# Patient Record
Sex: Female | Born: 1965 | Race: Black or African American | Hispanic: No | Marital: Single | Smoking: Never smoker
Health system: Southern US, Community
[De-identification: ages and names within clinical notes are randomized; demographics above are authoritative.]

## PROBLEM LIST (undated history)

## (undated) DIAGNOSIS — Z780 Asymptomatic menopausal state: Secondary | ICD-10-CM

## (undated) DIAGNOSIS — A6000 Herpesviral infection of urogenital system, unspecified: Secondary | ICD-10-CM

## (undated) DIAGNOSIS — E7800 Pure hypercholesterolemia, unspecified: Secondary | ICD-10-CM

## (undated) DIAGNOSIS — IMO0002 Reserved for concepts with insufficient information to code with codable children: Secondary | ICD-10-CM

## (undated) DIAGNOSIS — M5126 Other intervertebral disc displacement, lumbar region: Secondary | ICD-10-CM

## (undated) DIAGNOSIS — D509 Iron deficiency anemia, unspecified: Secondary | ICD-10-CM

## (undated) DIAGNOSIS — B977 Papillomavirus as the cause of diseases classified elsewhere: Secondary | ICD-10-CM

## (undated) DIAGNOSIS — J449 Chronic obstructive pulmonary disease, unspecified: Secondary | ICD-10-CM

## (undated) DIAGNOSIS — R87619 Unspecified abnormal cytological findings in specimens from cervix uteri: Secondary | ICD-10-CM

## (undated) DIAGNOSIS — R079 Chest pain, unspecified: Secondary | ICD-10-CM

## (undated) DIAGNOSIS — E559 Vitamin D deficiency, unspecified: Secondary | ICD-10-CM

## (undated) DIAGNOSIS — K219 Gastro-esophageal reflux disease without esophagitis: Secondary | ICD-10-CM

## (undated) DIAGNOSIS — M51369 Other intervertebral disc degeneration, lumbar region without mention of lumbar back pain or lower extremity pain: Secondary | ICD-10-CM

## (undated) DIAGNOSIS — D219 Benign neoplasm of connective and other soft tissue, unspecified: Secondary | ICD-10-CM

## (undated) DIAGNOSIS — J302 Other seasonal allergic rhinitis: Secondary | ICD-10-CM

## (undated) DIAGNOSIS — I10 Essential (primary) hypertension: Secondary | ICD-10-CM

## (undated) DIAGNOSIS — M549 Dorsalgia, unspecified: Secondary | ICD-10-CM

## (undated) HISTORY — DX: Asymptomatic menopausal state: Z78.0

## (undated) HISTORY — DX: Other intervertebral disc degeneration, lumbar region without mention of lumbar back pain or lower extremity pain: M51.369

## (undated) HISTORY — DX: Chest pain, unspecified: R07.9

## (undated) HISTORY — DX: Essential (primary) hypertension: I10

## (undated) HISTORY — PX: WISDOM TOOTH EXTRACTION: SHX21

## (undated) HISTORY — DX: Pure hypercholesterolemia, unspecified: E78.00

## (undated) HISTORY — DX: Gastro-esophageal reflux disease without esophagitis: K21.9

## (undated) HISTORY — DX: Dorsalgia, unspecified: M54.9

## (undated) HISTORY — DX: Vitamin D deficiency, unspecified: E55.9

## (undated) HISTORY — DX: Unspecified abnormal cytological findings in specimens from cervix uteri: R87.619

## (undated) HISTORY — DX: Papillomavirus as the cause of diseases classified elsewhere: B97.7

## (undated) HISTORY — DX: Herpesviral infection of urogenital system, unspecified: A60.00

## (undated) HISTORY — DX: Benign neoplasm of connective and other soft tissue, unspecified: D21.9

## (undated) HISTORY — DX: Reserved for concepts with insufficient information to code with codable children: IMO0002

## (undated) HISTORY — DX: Other intervertebral disc displacement, lumbar region: M51.26

## (undated) HISTORY — DX: Iron deficiency anemia, unspecified: D50.9

## (undated) HISTORY — DX: Other seasonal allergic rhinitis: J30.2

---

## 1998-02-27 ENCOUNTER — Ambulatory Visit (HOSPITAL_COMMUNITY): Admission: RE | Admit: 1998-02-27 | Discharge: 1998-02-27 | Payer: Self-pay | Admitting: *Deleted

## 1998-05-03 ENCOUNTER — Other Ambulatory Visit: Admission: RE | Admit: 1998-05-03 | Discharge: 1998-05-03 | Payer: Self-pay | Admitting: Obstetrics & Gynecology

## 1998-10-27 ENCOUNTER — Ambulatory Visit (HOSPITAL_COMMUNITY): Admission: RE | Admit: 1998-10-27 | Discharge: 1998-10-27 | Payer: Self-pay | Admitting: *Deleted

## 1999-06-12 ENCOUNTER — Ambulatory Visit (HOSPITAL_COMMUNITY): Admission: RE | Admit: 1999-06-12 | Discharge: 1999-06-12 | Payer: Self-pay | Admitting: Cardiology

## 1999-10-11 ENCOUNTER — Other Ambulatory Visit: Admission: RE | Admit: 1999-10-11 | Discharge: 1999-10-11 | Payer: Self-pay | Admitting: Obstetrics & Gynecology

## 2000-10-25 ENCOUNTER — Emergency Department (HOSPITAL_COMMUNITY): Admission: EM | Admit: 2000-10-25 | Discharge: 2000-10-25 | Payer: Self-pay | Admitting: Emergency Medicine

## 2001-10-02 ENCOUNTER — Other Ambulatory Visit: Admission: RE | Admit: 2001-10-02 | Discharge: 2001-10-02 | Payer: Self-pay | Admitting: Obstetrics and Gynecology

## 2001-10-06 ENCOUNTER — Encounter: Payer: Self-pay | Admitting: Obstetrics and Gynecology

## 2001-10-06 ENCOUNTER — Encounter: Admission: RE | Admit: 2001-10-06 | Discharge: 2001-10-06 | Payer: Self-pay | Admitting: Obstetrics and Gynecology

## 2002-08-20 ENCOUNTER — Ambulatory Visit (HOSPITAL_BASED_OUTPATIENT_CLINIC_OR_DEPARTMENT_OTHER): Admission: RE | Admit: 2002-08-20 | Discharge: 2002-08-20 | Payer: Self-pay | Admitting: Urology

## 2002-10-19 ENCOUNTER — Encounter (HOSPITAL_BASED_OUTPATIENT_CLINIC_OR_DEPARTMENT_OTHER): Payer: Self-pay | Admitting: General Surgery

## 2002-10-19 ENCOUNTER — Encounter: Admission: RE | Admit: 2002-10-19 | Discharge: 2002-10-19 | Payer: Self-pay | Admitting: General Surgery

## 2003-10-12 ENCOUNTER — Encounter: Admission: RE | Admit: 2003-10-12 | Discharge: 2003-10-12 | Payer: Self-pay | Admitting: Obstetrics and Gynecology

## 2003-10-12 ENCOUNTER — Encounter: Payer: Self-pay | Admitting: Obstetrics and Gynecology

## 2003-11-01 ENCOUNTER — Other Ambulatory Visit: Admission: RE | Admit: 2003-11-01 | Discharge: 2003-11-01 | Payer: Self-pay | Admitting: Obstetrics and Gynecology

## 2004-01-10 ENCOUNTER — Encounter: Admission: RE | Admit: 2004-01-10 | Discharge: 2004-01-10 | Payer: Self-pay | Admitting: Obstetrics and Gynecology

## 2005-01-24 ENCOUNTER — Other Ambulatory Visit: Admission: RE | Admit: 2005-01-24 | Discharge: 2005-01-24 | Payer: Self-pay | Admitting: Obstetrics and Gynecology

## 2005-04-30 ENCOUNTER — Encounter: Admission: RE | Admit: 2005-04-30 | Discharge: 2005-04-30 | Payer: Self-pay | Admitting: Obstetrics and Gynecology

## 2006-06-02 ENCOUNTER — Encounter: Admission: RE | Admit: 2006-06-02 | Discharge: 2006-06-02 | Payer: Self-pay | Admitting: Obstetrics and Gynecology

## 2006-06-25 ENCOUNTER — Other Ambulatory Visit: Admission: RE | Admit: 2006-06-25 | Discharge: 2006-06-25 | Payer: Self-pay | Admitting: Obstetrics and Gynecology

## 2007-03-27 ENCOUNTER — Emergency Department (HOSPITAL_COMMUNITY): Admission: EM | Admit: 2007-03-27 | Discharge: 2007-03-27 | Payer: Self-pay | Admitting: Emergency Medicine

## 2007-07-30 ENCOUNTER — Encounter: Admission: RE | Admit: 2007-07-30 | Discharge: 2007-07-30 | Payer: Self-pay | Admitting: Internal Medicine

## 2007-08-06 ENCOUNTER — Encounter: Admission: RE | Admit: 2007-08-06 | Discharge: 2007-08-06 | Payer: Self-pay | Admitting: Internal Medicine

## 2008-02-26 ENCOUNTER — Emergency Department (HOSPITAL_COMMUNITY): Admission: EM | Admit: 2008-02-26 | Discharge: 2008-02-26 | Payer: Self-pay | Admitting: Emergency Medicine

## 2008-08-23 ENCOUNTER — Encounter: Admission: RE | Admit: 2008-08-23 | Discharge: 2008-08-23 | Payer: Self-pay | Admitting: Internal Medicine

## 2008-11-29 ENCOUNTER — Encounter: Admission: RE | Admit: 2008-11-29 | Discharge: 2008-11-29 | Payer: Self-pay | Admitting: Internal Medicine

## 2009-08-29 ENCOUNTER — Encounter: Admission: RE | Admit: 2009-08-29 | Discharge: 2009-08-29 | Payer: Self-pay | Admitting: Obstetrics and Gynecology

## 2011-05-17 NOTE — Op Note (Signed)
   Michelle Joseph, DOWERS                        ACCOUNT NO.:  [id number]   MEDICAL RECORD NO.:  [id number]                    PATIENT TYPE:   LOCATION:                                       FACILITY:   PHYSICIAN:  Lindaann Slough, M.D.               DATE OF BIRTH:   DATE OF PROCEDURE:  08/20/2002  DATE OF DISCHARGE:                                 OPERATIVE REPORT   PREOPERATIVE DIAGNOSIS:  1. Recurrent urinary tract infection.  2. Meatal stenosis.   POSTOPERATIVE DIAGNOSES:  1. Recurrent urinary tract infection.  2. Meatal stenosis.   PROCEDURES DONE:  1. Cystoscopy.  2. Urethral dilation.   SURGEON:  Lindaann Slough, M.D.   ANESTHESIA:  General.   INDICATIONS:  The patient is a 45 year old female who has been complaining  of frequency, urgency and suprapubic discomfort.  She was treated with  Detrol LA, but her symptoms have persisted.  She is scheduled today for  cystoscopy and urethral dilation.   DESCRIPTION OF PROCEDURE:  Under general anesthesia, the patient was prepped  and draped and placed in the dorsal lithotomy position.  A #22 [place]  cystoscope was inserted in the bladder.  The bladder mucosa is normal.  There is no stone or tumor in the bladder.  The ureteral orifices are in  normal position and shape, with clear efflux.  The bladder capacity is about  900 cc.  The cystoscope was then removed.  The urethra was dilated with 98-  [place].  The patient tolerated the procedure well and left the OR in  satisfactory condition to the post-anesthesia care unit.                                                Lindaann Slough, M.D.    MN/MEDQ  D:  08/20/2002  T:  08/23/2002  Job:  [id number]

## 2012-04-13 ENCOUNTER — Telehealth: Payer: Self-pay | Admitting: Obstetrics and Gynecology

## 2012-04-13 NOTE — Telephone Encounter (Signed)
Routed to ep

## 2012-04-14 ENCOUNTER — Telehealth: Payer: Self-pay | Admitting: Obstetrics and Gynecology

## 2012-04-14 NOTE — Telephone Encounter (Signed)
PT CALLED WANTING TO KNOW IF SHE NEEDS TO CONTINUE DOING HER Q4MONTH PAPS, INFORMED PER EP, NEEDS ONE MORE PAP, WAS DUE IN MARCH, PT SCHED RPT PAP ON Monday 04/27/12 @ 2 PM WITH EP.

## 2012-04-27 ENCOUNTER — Encounter: Payer: Self-pay | Admitting: Obstetrics and Gynecology

## 2012-04-28 ENCOUNTER — Ambulatory Visit (INDEPENDENT_AMBULATORY_CARE_PROVIDER_SITE_OTHER): Payer: No Typology Code available for payment source | Admitting: Obstetrics and Gynecology

## 2012-04-28 ENCOUNTER — Encounter: Payer: Self-pay | Admitting: Obstetrics and Gynecology

## 2012-04-28 VITALS — BP 116/72 | HR 70 | Ht 69.0 in | Wt 157.0 lb

## 2012-04-28 DIAGNOSIS — D259 Leiomyoma of uterus, unspecified: Secondary | ICD-10-CM

## 2012-04-28 DIAGNOSIS — Z124 Encounter for screening for malignant neoplasm of cervix: Secondary | ICD-10-CM

## 2012-04-28 DIAGNOSIS — R82998 Other abnormal findings in urine: Secondary | ICD-10-CM

## 2012-04-28 DIAGNOSIS — R8290 Unspecified abnormal findings in urine: Secondary | ICD-10-CM

## 2012-04-28 DIAGNOSIS — Z01419 Encounter for gynecological examination (general) (routine) without abnormal findings: Secondary | ICD-10-CM

## 2012-04-28 DIAGNOSIS — M5126 Other intervertebral disc displacement, lumbar region: Secondary | ICD-10-CM

## 2012-04-28 DIAGNOSIS — N898 Other specified noninflammatory disorders of vagina: Secondary | ICD-10-CM

## 2012-04-28 DIAGNOSIS — D219 Benign neoplasm of connective and other soft tissue, unspecified: Secondary | ICD-10-CM | POA: Insufficient documentation

## 2012-04-28 DIAGNOSIS — A6000 Herpesviral infection of urogenital system, unspecified: Secondary | ICD-10-CM

## 2012-04-28 LAB — POCT WET PREP (WET MOUNT): Clue Cells Wet Prep Whiff POC: NEGATIVE

## 2012-04-28 LAB — POCT URINALYSIS DIPSTICK
Leukocytes, UA: NEGATIVE
Nitrite, UA: NEGATIVE
Urobilinogen, UA: NEGATIVE

## 2012-04-28 MED ORDER — FLUCONAZOLE 150 MG PO TABS: 150.0000 mg | ORAL_TABLET | Freq: Once | ORAL | Status: AC
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
## 2012-04-28 NOTE — Patient Instructions (Signed)
Give patient information on all types of hysterectomies.  You may research myomectomy and fibroid embolization (uterine artery embolization)

## 2012-04-28 NOTE — Progress Notes (Signed)
Subjective:    Michelle Joseph is a 46 y.o. female, G1P1001, who presents for an annual exam. She reports an itchy vaginal discharge x several weeks with varying intensity.    History   Social History  . Marital Status: Divorced    Spouse Name: N/A    Number of Children: N/A  . Years of Education: N/A   Social History Main Topics  . Smoking status: Never Smoker   . Smokeless tobacco: None  . Alcohol Use: No  . Drug Use: No  . Sexually Active: Not Currently -- Female partner(s)    Birth Control/ Protection: None   Other Topics Concern  . None   Social History Narrative  . None    Menstrual cycle:   LMP: Patient's last menstrual period was 04/12/2012.           Cycle: flow is light and with minimal cramping  The following portions of the patient's history were reviewed and updated as appropriate: allergies, current medications, past family history, past medical history, past social history, past surgical history and problem list.  Review of Systems Pertinent items are noted in HPI. Breast:Negative for breast lump,nipple discharge or nipple retraction Gastrointestinal: Negative for abdominal pain, change in bowel habits or rectal bleeding Urinary:negative   Objective:    BP 116/72  Pulse 70  Ht 5\' 9"  (1.753 m)  Wt 157 lb (71.215 kg)  BMI 23.18 kg/m2  LMP 04/12/2012    Weight:  Wt Readings from Last 1 Encounters:  04/28/12 157 lb (71.215 kg)          BMI: Body mass index is 23.18 kg/(m^2).  General Appearance: Alert, appropriate appearance for age. No acute distress HEENT: Grossly normal Neck / Thyroid: Supple, no masses, nodes or enlargement Lungs: clear to auscultation bilaterally Back: No CVA tenderness Breast Exam: No masses or nodes.No dimpling, nipple retraction or discharge. Cardiovascular: Regular rate and rhythm. S1, S2, no murmur Gastrointestinal: Soft, no organomegaly, firm mass from pelvis to level of umbilicus on left, mildly tender Pelvic Exam:  EGBUS-wnl, vagina-rugous, cervix-displaced very anterior,  uterus 16-18 weeks size, irregular and mobile Rectovaginal: normal rectal, no masses Lymphatic Exam: Non-palpable nodes in neck, clavicular, axillary, or inguinal regions Skin: no rash or abnormalities Neurologic: Normal gait and speech, no tremor  Psychiatric: Alert and oriented, appropriate affect.   Wet Prep: pH 4.5 - whiff, no clue, trich or yeast Urinalysis:not applicable UPT: Not done   Assessment:    Routine Gyn Exam  Large Fibroids Increased Abdominal Girth due to Fibroids Pruritic Vaginitis Plan:    PAP sent  Reviewed medical and surgical management options for fibroids to include observation  Patient to research fibroid embolization, myomectomy and  hysterectomy  Diflucan 150 mg #1 1 po stat   Given [email or web address] website to review robotic procedures.  Patient wants to schedule with Dr. Estanislado Pandy for consultation    [facility] PA-C

## 2012-05-01 LAB — PAP IG W/ RFLX HPV ASCU

## 2012-06-01 ENCOUNTER — Encounter (HOSPITAL_COMMUNITY): Payer: Self-pay

## 2012-06-01 ENCOUNTER — Emergency Department (HOSPITAL_COMMUNITY)
Admission: EM | Admit: 2012-06-01 | Discharge: 2012-06-01 | Disposition: A | Discharge status: Home / Self Care | Payer: PRIVATE HEALTH INSURANCE | Source: Home / Self Care | Attending: Emergency Medicine | Admitting: Emergency Medicine

## 2012-06-01 DIAGNOSIS — J209 Acute bronchitis, unspecified: Secondary | ICD-10-CM

## 2012-06-01 DIAGNOSIS — M940 Chondrocostal junction syndrome [Tietze]: Secondary | ICD-10-CM

## 2012-06-01 MED ORDER — ALBUTEROL SULFATE HFA 108 (90 BASE) MCG/ACT IN AERS: 1.0000 | INHALATION_SPRAY | Freq: Four times a day (QID) | RESPIRATORY_TRACT | Status: DC | PRN

## 2012-06-01 MED ORDER — MELOXICAM 15 MG PO TABS: 15.0000 mg | ORAL_TABLET | Freq: Every day | ORAL | Status: DC

## 2012-06-01 MED ORDER — BENZONATATE 200 MG PO CAPS: 200.0000 mg | ORAL_CAPSULE | Freq: Three times a day (TID) | ORAL | Status: AC | PRN

## 2012-06-01 MED ORDER — AZITHROMYCIN 250 MG PO TABS: ORAL_TABLET | ORAL | Status: AC

## 2012-06-01 NOTE — ED Provider Notes (Signed)
Chief Complaint  Patient presents with  . Chest Pain    History of Present Illness:   Michelle Joseph is a 46 year old female who has had a one-week history of chest discomfort that comes and goes. She describes it as a tightness or heaviness and is worse if she lies down. It's not any worse with exercise or exertion. It's not associated with nausea or diaphoresis. She sometimes feels like it's hard to breathe. She denies any wheezing but she did have asthma as a child. She's had intermittent dry cough and some rhinorrhea. She describes some occasional heart palpitations and has felt dizzy but no syncope. She denies any cardiac history. She has no history of diabetes, hypertension, hyperlipidemia, cigarette smoking, or family history of heart disease.  Review of Systems:  Other than noted above, the patient denies any of the following symptoms. Systemic:  No fever, chills, sweats, or fatigue. ENT:  No nasal congestion, rhinorrhea, or sore throat. Pulmonary:  No cough, wheezing, shortness of breath, sputum production, hemoptysis. Cardiac:  No palpitations, rapid heartbeat, dizziness, presyncope or syncope. GI:  No abdominal pain, heartburn, nausea, or vomiting. Skin:  No rash or itching. Ext:  No leg pain or swelling.   PMFSH:  Past medical history, family history, social history, meds, and allergies were reviewed and updated as needed.  Physical Exam:   Vital signs:  BP 127/66  Pulse 94  Temp(Src) 98.3 F (36.8 C) (Oral)  Resp 16  SpO2 100%  LMP 05/30/2012 Gen:  Alert, oriented, in no distress, skin warm and dry. Eye:  PERRL, lids and conjunctivas normal.  Sclera non-icteric. ENT:  Mucous membranes moist, pharynx clear. Neck:  Supple, no adenopathy or tenderness.  No JVD. Lungs:  Clear to auscultation, no wheezes, rales or rhonchi.  No respiratory distress. Heart:  Regular rhythm.  No gallops, murmers, clicks or rubs. Chest: She had localized tenderness to palpation along the mid left sternal  border. Abdomen:  Soft, nontender, no organomegaly or mass.  Bowel sounds normal.  No pulsatile abdominal mass or bruit. Ext:  No edema.  No calf tenderness and Homann's sign negative.  Pulses full and equal. Skin:  Warm and dry.  No rash.   Radiology:  No results found.  EKG:   Date: 06/01/2012  Rate: 71  Rhythm: normal sinus rhythm  QRS Axis: normal  Intervals: normal  ST/T Wave abnormalities: normal  Conduction Disutrbances:none  Narrative Interpretation: Normal sinus rhythm, minimal voltage criteria for LVH, maybe normal variant.  Old EKG Reviewed: none available  Assessment:  The primary encounter diagnosis was Costochondritis. A diagnosis of Acute bronchitis was also pertinent to this visit. It's unlikely that her symptoms are cardiac. I think she may have a mild asthmatic bronchitis. She has a history of asthma as a child. She may also have some costochondritis as well.  Plan:   1.  The following meds were prescribed:   New Prescriptions   ALBUTEROL (PROVENTIL HFA;VENTOLIN HFA) 108 (90 BASE) MCG/ACT INHALER    Inhale 1-2 puffs into the lungs every 6 (six) hours as needed for wheezing.   AZITHROMYCIN (ZITHROMAX Z-PAK) 250 MG TABLET    Take as directed.   BENZONATATE (TESSALON) 200 MG CAPSULE    Take 1 capsule (200 mg total) by mouth 3 (three) times daily as needed for cough.   MELOXICAM (MOBIC) 15 MG TABLET    Take 1 tablet (15 mg total) by mouth daily.   2.  The patient was instructed in symptomatic care and handouts  were given. 3.  The patient was told to return if becoming worse in any way, if no better in 3 or 4 days, and given some red flag symptoms that would indicate earlier return.    Reuben Likes, MD 06/01/12 2134

## 2012-06-01 NOTE — Discharge Instructions (Signed)
Acute Bronchitis You have acute bronchitis. This means you have a chest cold. The airways in your lungs are red and sore (inflamed). Acute means it is sudden onset.  CAUSES Bronchitis is most often caused by the same virus that causes a cold. SYMPTOMS   Body aches.   Chest congestion.   Chills.   Cough.   Fever.   Shortness of breath.   Sore throat.  TREATMENT  Acute bronchitis is usually treated with rest, fluids, and medicines for relief of fever or cough. Most symptoms should go away after a few days or a week. Increased fluids may help thin your secretions and will prevent dehydration. Your caregiver may give you an inhaler to improve your symptoms. The inhaler reduces shortness of breath and helps control cough. You can take over-the-counter pain relievers or cough medicine to decrease coughing, pain, or fever. A cool-air vaporizer may help thin bronchial secretions and make it easier to clear your chest. Antibiotics are usually not needed but can be prescribed if you smoke, are seriously ill, have chronic lung problems, are elderly, or you are at higher risk for developing complications.Allergies and asthma can make bronchitis worse. Repeated episodes of bronchitis may cause longstanding lung problems. Avoid smoking and secondhand smoke.Exposure to cigarette smoke or irritating chemicals will make bronchitis worse. If you are a cigarette smoker, consider using nicotine gum or skin patches to help control withdrawal symptoms. Quitting smoking will help your lungs heal faster. Recovery from bronchitis is often slow, but you should start feeling better after 2 to 3 days. Cough from bronchitis frequently lasts for 3 to 4 weeks. To prevent another bout of acute bronchitis:  Quit smoking.   Wash your hands frequently to get rid of viruses or use a hand sanitizer.   Avoid other people with cold or virus symptoms.   Try not to touch your hands to your mouth, nose, or eyes.  SEEK  IMMEDIATE MEDICAL CARE IF:  You develop increased fever, chills, or chest pain.   You have severe shortness of breath or bloody sputum.   You develop dehydration, fainting, repeated vomiting, or a severe headache.   You have no improvement after 1 week of treatment or you get worse.  MAKE SURE YOU:   Understand these instructions.   Will watch your condition.   Will get help right away if you are not doing well or get worse.  Document Released: 01/23/2005 Document Revised: 12/05/2011 Document Reviewed: 04/10/2011 [facility] Patient Information 2012 [place], [place].Costochondritis Costochondritis (Tietze syndrome), or costochondral separation, is a swelling and irritation (inflammation) of the tissue (cartilage) that connects your ribs with your breastbone (sternum). It may occur on its own (spontaneously), through damage caused by an accident (trauma), or simply from coughing or minor exercise. It may take up to 6 weeks to get better and longer if you are unable to be conservative in your activities. HOME CARE INSTRUCTIONS   Avoid exhausting physical activity. Try not to strain your ribs during normal activity. This would include any activities using chest, belly (abdominal), and side muscles, especially if heavy weights are used.   Use ice for 15 to 20 minutes per hour while awake for the first 2 days. Place the ice in a plastic bag, and place a towel between the bag of ice and your skin.   Only take over-the-counter or prescription medicines for pain, discomfort, or fever as directed by your caregiver.  SEEK IMMEDIATE MEDICAL CARE IF:   Your pain increases or you  are very uncomfortable.   You have a fever.   You develop difficulty with your breathing.   You cough up blood.   You develop worse chest pains, shortness of breath, sweating, or vomiting.   You develop new, unexplained problems (symptoms).  MAKE SURE YOU:   Understand these instructions.   Will watch your  condition.   Will get help right away if you are not doing well or get worse.  Document Released: 09/25/2005 Document Revised: 12/05/2011 Document Reviewed: 08/03/2008 [facility] Patient Information 2012 [place], [place].Acute Bronchitis You have acute bronchitis. This means you have a chest cold. The airways in your lungs are red and sore (inflamed). Acute means it is sudden onset.  CAUSES Bronchitis is most often caused by the same virus that causes a cold. SYMPTOMS   Body aches.   Chest congestion.   Chills.   Cough.   Fever.   Shortness of breath.   Sore throat.  TREATMENT  Acute bronchitis is usually treated with rest, fluids, and medicines for relief of fever or cough. Most symptoms should go away after a few days or a week. Increased fluids may help thin your secretions and will prevent dehydration. Your caregiver may give you an inhaler to improve your symptoms. The inhaler reduces shortness of breath and helps control cough. You can take over-the-counter pain relievers or cough medicine to decrease coughing, pain, or fever. A cool-air vaporizer may help thin bronchial secretions and make it easier to clear your chest. Antibiotics are usually not needed but can be prescribed if you smoke, are seriously ill, have chronic lung problems, are elderly, or you are at higher risk for developing complications.Allergies and asthma can make bronchitis worse. Repeated episodes of bronchitis may cause longstanding lung problems. Avoid smoking and secondhand smoke.Exposure to cigarette smoke or irritating chemicals will make bronchitis worse. If you are a cigarette smoker, consider using nicotine gum or skin patches to help control withdrawal symptoms. Quitting smoking will help your lungs heal faster. Recovery from bronchitis is often slow, but you should start feeling better after 2 to 3 days. Cough from bronchitis frequently lasts for 3 to 4 weeks. To prevent another bout of acute  bronchitis:  Quit smoking.   Wash your hands frequently to get rid of viruses or use a hand sanitizer.   Avoid other people with cold or virus symptoms.   Try not to touch your hands to your mouth, nose, or eyes.  SEEK IMMEDIATE MEDICAL CARE IF:  You develop increased fever, chills, or chest pain.   You have severe shortness of breath or bloody sputum.   You develop dehydration, fainting, repeated vomiting, or a severe headache.   You have no improvement after 1 week of treatment or you get worse.  MAKE SURE YOU:   Understand these instructions.   Will watch your condition.   Will get help right away if you are not doing well or get worse.  Document Released: 01/23/2005 Document Revised: 12/05/2011 Document Reviewed: 04/10/2011 [facility] Patient Information 2012 [place], [place].

## 2012-06-01 NOTE — ED Notes (Signed)
C/o she has been having pain in left chest and tightness for past ~10 days or so; c/o tightness is worse when she lays down, feels as if she is going to lose her breath. Pain worse on left side w pressure to chest wall/CCM ; has  Been using left over tyl #3 and singular w ?relief of syx; c/o has cough, no secretions ; hist of heart murmur, father age 45 had 4 vessel bypass, mother has no heart hist, brother has no heart hist

## 2012-06-26 ENCOUNTER — Ambulatory Visit
Admission: RE | Admit: 2012-06-26 | Discharge: 2012-06-26 | Disposition: A | Discharge status: Home / Self Care | Payer: PRIVATE HEALTH INSURANCE | Source: Ambulatory Visit | Attending: Nurse Practitioner | Admitting: Nurse Practitioner

## 2012-06-26 ENCOUNTER — Other Ambulatory Visit: Payer: Self-pay | Admitting: Nurse Practitioner

## 2012-06-26 DIAGNOSIS — R059 Cough, unspecified: Secondary | ICD-10-CM

## 2012-06-26 DIAGNOSIS — R05 Cough: Secondary | ICD-10-CM

## 2012-07-13 ENCOUNTER — Other Ambulatory Visit (HOSPITAL_COMMUNITY): Payer: Self-pay | Admitting: Cardiology

## 2012-07-13 DIAGNOSIS — R079 Chest pain, unspecified: Secondary | ICD-10-CM

## 2012-07-20 ENCOUNTER — Other Ambulatory Visit (HOSPITAL_COMMUNITY): Payer: PRIVATE HEALTH INSURANCE

## 2012-07-20 ENCOUNTER — Inpatient Hospital Stay (HOSPITAL_COMMUNITY): Admission: RE | Admit: 2012-07-20 | Payer: PRIVATE HEALTH INSURANCE | Source: Ambulatory Visit

## 2012-07-22 ENCOUNTER — Encounter (HOSPITAL_COMMUNITY): Payer: Self-pay | Admitting: Emergency Medicine

## 2012-07-22 ENCOUNTER — Emergency Department (HOSPITAL_COMMUNITY): Payer: PRIVATE HEALTH INSURANCE

## 2012-07-22 ENCOUNTER — Encounter (HOSPITAL_COMMUNITY)
Admission: RE | Admit: 2012-07-22 | Discharge: 2012-07-22 | Disposition: A | Discharge status: Home / Self Care | Payer: PRIVATE HEALTH INSURANCE | Source: Ambulatory Visit | Attending: Cardiology | Admitting: Cardiology

## 2012-07-22 ENCOUNTER — Other Ambulatory Visit: Payer: Self-pay

## 2012-07-22 ENCOUNTER — Emergency Department (HOSPITAL_COMMUNITY)
Admission: EM | Admit: 2012-07-22 | Discharge: 2012-07-22 | Disposition: A | Discharge status: Home / Self Care | Payer: PRIVATE HEALTH INSURANCE | Attending: Emergency Medicine | Admitting: Emergency Medicine

## 2012-07-22 DIAGNOSIS — R079 Chest pain, unspecified: Secondary | ICD-10-CM | POA: Insufficient documentation

## 2012-07-22 DIAGNOSIS — R0602 Shortness of breath: Secondary | ICD-10-CM | POA: Insufficient documentation

## 2012-07-22 LAB — POCT I-STAT TROPONIN I: Troponin i, poc: 0 ng/mL (ref 0.00–0.08)

## 2012-07-22 LAB — CBC
HCT: 37.7 % (ref 36.0–46.0)
MCV: 76.2 fL — ABNORMAL LOW (ref 78.0–100.0)
Platelets: 267 10*3/uL (ref 150–400)
RBC: 4.95 MIL/uL (ref 3.87–5.11)
WBC: 6 10*3/uL (ref 4.0–10.5)

## 2012-07-22 LAB — BASIC METABOLIC PANEL
BUN: 11 mg/dL (ref 6–23)
CO2: 24 mEq/L (ref 19–32)
Chloride: 102 mEq/L (ref 96–112)
Creatinine, Ser: 0.98 mg/dL (ref 0.50–1.10)
GFR calc Af Amer: 80 mL/min — ABNORMAL LOW (ref >90)
Potassium: 6.5 mEq/L (ref 3.5–5.1)

## 2012-07-22 LAB — POTASSIUM: Potassium: 3.4 mEq/L — ABNORMAL LOW (ref 3.5–5.1)

## 2012-07-22 MED ORDER — REGADENOSON 0.4 MG/5ML IV SOLN
INTRAVENOUS | Status: AC
  Filled 2012-07-22: qty 5

## 2012-07-22 MED ORDER — ASPIRIN 81 MG PO CHEW
324.0000 mg | CHEWABLE_TABLET | Freq: Once | ORAL | Status: AC
  Administered 2012-07-22: 324 mg via ORAL
  Filled 2012-07-22: qty 4

## 2012-07-22 MED ORDER — ASPIRIN 325 MG PO TABS: 325.0000 mg | ORAL_TABLET | ORAL | Status: DC

## 2012-07-22 MED ORDER — METOPROLOL TARTRATE 1 MG/ML IV SOLN
INTRAVENOUS | Status: AC
  Filled 2012-07-22: qty 5

## 2012-07-22 MED ORDER — TECHNETIUM TC 99M TETROFOSMIN IV KIT
10.0000 | PACK | Freq: Once | INTRAVENOUS | Status: AC | PRN
  Administered 2012-07-22: 10 via INTRAVENOUS

## 2012-07-22 MED ORDER — REGADENOSON 0.4 MG/5ML IV SOLN
0.4000 mg | Freq: Once | INTRAVENOUS | Status: AC
  Administered 2012-07-22: 0.4 mg via INTRAVENOUS

## 2012-07-22 MED ORDER — NITROGLYCERIN 0.4 MG SL SUBL: 0.4000 mg | SUBLINGUAL_TABLET | SUBLINGUAL | Status: DC | PRN

## 2012-07-22 MED ORDER — TECHNETIUM TC 99M TETROFOSMIN IV KIT
30.0000 | PACK | Freq: Once | INTRAVENOUS | Status: AC | PRN
  Administered 2012-07-22: 30 via INTRAVENOUS

## 2012-07-22 NOTE — ED Notes (Signed)
SOB chest pain onset one hour radiates to L shoulder and neck admits to SOB

## 2012-07-22 NOTE — Progress Notes (Signed)
Correct Vitals are HR 86 and B/P 125/66

## 2012-07-22 NOTE — ED Provider Notes (Signed)
History     CSN: [id number]  Arrival date & time 07/22/12  0303   First MD Initiated Contact with Patient 07/22/12 [phone]      Chief Complaint  Patient presents with  . Chest Pain  . Shortness of Breath    (Consider location/radiation/quality/duration/timing/severity/associated sxs/prior treatment) HPI History provided by patient. For last 3 weeks his been having on-and-off chest pains and some shortness of breath with a mild cough. She was initially seen at the urgent care and given medications for bronchitis. She finished a Z-Pak with no change in her symptoms. She saw Dr. Sharyn Lull about 15 years ago for heart fluttering and palpitations and followed up with him more recently again for her chest pains. She is scheduled for a stress test this morning at 8:30.  Pain has been more persistent over last 3 days. Tonight unable to sleep. She denies feeling anxious about this. Pain is left-sided and not radiating. Rates pain at 4/10 in severity but declines any medications. She took 4 baby aspirin prior to arrival. No known aggravating or alleviating factors. Past Medical History  Diagnosis Date  . Fibroids   . Herpesvirus 2   . Bulging lumbar disc     History reviewed. No pertinent past surgical history.  Family History  Problem Relation Age of Onset  . Heart attack Father   . Cancer Mother     breast  . Hypertension Mother     History  Substance Use Topics  . Smoking status: Never Smoker   . Smokeless tobacco: Not on file  . Alcohol Use: No    OB History    Grav Para Term Preterm Abortions TAB SAB Ect Mult Living   1 1 1       1       Review of Systems  Constitutional: Negative for fever and chills.  HENT: Negative for neck pain and neck stiffness.   Eyes: Negative for pain.  Respiratory: Positive for shortness of breath.   Cardiovascular: Positive for chest pain.  Gastrointestinal: Negative for abdominal pain.  Genitourinary: Negative for dysuria.  Musculoskeletal:  Negative for back pain.  Skin: Negative for rash.  Neurological: Negative for headaches.  All other systems reviewed and are negative.    Allergies  Eggs or egg-derived products; Corn-containing products; Milk-related compounds; and Penicillins  Home Medications   Current Outpatient Rx  Name Route Sig Dispense Refill  . ALBUTEROL SULFATE HFA 108 (90 BASE) MCG/ACT IN AERS Inhalation Inhale 1-2 puffs into the lungs every 6 (six) hours as needed for wheezing. 1 Inhaler 0  . LORATADINE 10 MG PO TABS Oral Take 10 mg by mouth daily.    Marland Kitchen ONE-DAILY MULTI VITAMINS PO TABS Oral Take 1 tablet by mouth daily.    . MELOXICAM 15 MG PO TABS Oral Take 1 tablet (15 mg total) by mouth daily. 15 tablet 0    BP 144/71  Pulse 89  Temp 98.2 F (36.8 C)  Resp 18  Ht 5\' 9"  (1.753 m)  Wt 155 lb (70.308 kg)  BMI 22.89 kg/m2  SpO2 100%  LMP 07/22/2012  Physical Exam  Constitutional: She is oriented to person, place, and time. She appears well-developed and well-nourished.  HENT:  Head: Normocephalic and atraumatic.  Eyes: Conjunctivae and EOM are normal. Pupils are equal, round, and reactive to light.  Neck: Trachea normal. Neck supple. No thyromegaly present.  Cardiovascular: Normal rate, regular rhythm, S1 normal, S2 normal and normal pulses.     No systolic murmur  is present   No diastolic murmur is present  Pulses:      Radial pulses are 2+ on the right side, and 2+ on the left side.  Pulmonary/Chest: Effort normal and breath sounds normal. She has no wheezes. She has no rhonchi. She has no rales. She exhibits no tenderness.  Abdominal: Soft. Normal appearance and bowel sounds are normal. There is no tenderness. There is no CVA tenderness and negative Murphy's sign.  Musculoskeletal:       BLE:s Calves nontender, no cords or erythema, negative Homans sign  Neurological: She is alert and oriented to person, place, and time. She has normal strength. No cranial nerve deficit or sensory deficit.  GCS eye subscore is 4. GCS verbal subscore is 5. GCS motor subscore is 6.  Skin: Skin is warm and dry. No rash noted. She is not diaphoretic.  Psychiatric: Her speech is normal.       Cooperative and appropriate    ED Course  Procedures (including critical care time)  Results for orders placed during the hospital encounter of 07/22/12  CBC      Component Value Range   WBC 6.0  4.0 - 10.5 K/uL   RBC 4.95  3.87 - 5.11 MIL/uL   Hemoglobin 11.9 (*) 12.0 - 15.0 g/dL   HCT 96.0  45.4 - 09.8 %   MCV 76.2 (*) 78.0 - 100.0 fL   MCH 24.0 (*) 26.0 - 34.0 pg   MCHC 31.6  30.0 - 36.0 g/dL   RDW 11.9 (*) 14.7 - 82.9 %   Platelets 267  150 - 400 K/uL  POCT I-STAT TROPONIN I      Component Value Range   Troponin i, poc 0.00  0.00 - 0.08 ng/mL   Comment 3            Dg Chest 2 View  06/26/2012  *RADIOLOGY REPORT*  Clinical Data: Cough.  CHEST - 2 VIEW  Comparison: None  Findings: The cardiac silhouette, mediastinal and hilar contours are within normal limits.  The lungs are clear.  No pleural effusion.  The bony thorax is intact.  IMPRESSION: No acute cardiopulmonary findings.  Original Report Authenticated By: P. Loralie Champagne, M.D.     Date: 07/22/2012  Rate: 85  Rhythm: normal sinus rhythm  QRS Axis: normal  Intervals: normal  ST/T Wave abnormalities: nonspecific ST changes  Conduction Disutrbances:none  Narrative Interpretation:   Old EKG Reviewed: unchanged  Aspirin prior to arrival.  Patient declines any other medications. She is scheduled for stress test at 0830 a.m. here at [facility].  Labs reviewed as above. 3 days of chest pain with negative troponin.  patient stable for her scheduled stress test today. MDM   Nursing notes reviewed. Old records reviewed. Vital signs reviewed. EKG, labs and x-ray reviewed as above.        Sunnie Nielsen, MD 07/22/12 [phone]

## 2012-09-17 ENCOUNTER — Telehealth: Payer: Self-pay

## 2012-09-17 NOTE — Telephone Encounter (Signed)
Pt calling requesting Diflucan.  Pt states she doesn't think she has yeast infection.  Main complaint is frequent urination and pressure.  Feels like she empties but not completely. No leaking.  No pain.  Pt saw PCP and had clear u/a.  Suggested pt try increasing water intake, decrease bladder irritants such as caffeine, chocolate etc.  Can try cranberry juice or supplement (capsule).  Must take with food.  Made appt. For 09-25-12.  Pt agreeable.  To call if worsens before then.  ld

## 2012-09-23 ENCOUNTER — Encounter (HOSPITAL_COMMUNITY): Payer: Self-pay | Admitting: Emergency Medicine

## 2012-09-23 ENCOUNTER — Emergency Department (HOSPITAL_COMMUNITY)
Admission: EM | Admit: 2012-09-23 | Discharge: 2012-09-23 | Disposition: A | Discharge status: Home / Self Care | Payer: PRIVATE HEALTH INSURANCE | Attending: Emergency Medicine | Admitting: Emergency Medicine

## 2012-09-23 DIAGNOSIS — Z8249 Family history of ischemic heart disease and other diseases of the circulatory system: Secondary | ICD-10-CM | POA: Insufficient documentation

## 2012-09-23 DIAGNOSIS — T59811A Toxic effect of smoke, accidental (unintentional), initial encounter: Secondary | ICD-10-CM

## 2012-09-23 DIAGNOSIS — Z91018 Allergy to other foods: Secondary | ICD-10-CM | POA: Insufficient documentation

## 2012-09-23 DIAGNOSIS — Z91011 Allergy to milk products: Secondary | ICD-10-CM | POA: Insufficient documentation

## 2012-09-23 DIAGNOSIS — Z803 Family history of malignant neoplasm of breast: Secondary | ICD-10-CM | POA: Insufficient documentation

## 2012-09-23 DIAGNOSIS — Z91012 Allergy to eggs: Secondary | ICD-10-CM | POA: Insufficient documentation

## 2012-09-23 DIAGNOSIS — Z880 Allergy status to penicillin: Secondary | ICD-10-CM | POA: Insufficient documentation

## 2012-09-23 DIAGNOSIS — J705 Respiratory conditions due to smoke inhalation: Secondary | ICD-10-CM | POA: Insufficient documentation

## 2012-09-23 LAB — BASIC METABOLIC PANEL
CO2: 25 mEq/L (ref 19–32)
Calcium: 9 mg/dL (ref 8.4–10.5)
Chloride: 101 mEq/L (ref 96–112)
Glucose, Bld: 88 mg/dL (ref 70–99)
Potassium: 3.7 mEq/L (ref 3.5–5.1)
Sodium: 135 mEq/L (ref 135–145)

## 2012-09-23 LAB — CARBOXYHEMOGLOBIN
Carboxyhemoglobin: 0.8 % (ref 0.5–1.5)
Methemoglobin: 1.2 % (ref 0.0–1.5)
O2 Saturation: 98.9 %
Total hemoglobin: 9.7 g/dL — ABNORMAL LOW (ref 12.0–16.0)

## 2012-09-23 LAB — CBC WITH DIFFERENTIAL/PLATELET
Basophils Absolute: 0.1 10*3/uL (ref 0.0–0.1)
Eosinophils Relative: 2 % (ref 0–5)
Lymphocytes Relative: 35 % (ref 12–46)
Lymphs Abs: 1.8 10*3/uL (ref 0.7–4.0)
MCV: 74.3 fL — ABNORMAL LOW (ref 78.0–100.0)
Neutro Abs: 2.7 10*3/uL (ref 1.7–7.7)
Neutrophils Relative %: 53 % (ref 43–77)
Platelets: 256 10*3/uL (ref 150–400)
RBC: 4.59 MIL/uL (ref 3.87–5.11)
RDW: 15.1 % (ref 11.5–15.5)
WBC: 5.1 10*3/uL (ref 4.0–10.5)

## 2012-09-23 MED ORDER — IPRATROPIUM BROMIDE 0.02 % IN SOLN
0.5000 mg | Freq: Once | RESPIRATORY_TRACT | Status: AC
  Administered 2012-09-23: 0.5 mg via RESPIRATORY_TRACT
  Filled 2012-09-23: qty 2.5

## 2012-09-23 MED ORDER — ALBUTEROL SULFATE (5 MG/ML) 0.5% IN NEBU
5.0000 mg | INHALATION_SOLUTION | Freq: Once | RESPIRATORY_TRACT | Status: AC
  Administered 2012-09-23: 5 mg via RESPIRATORY_TRACT
  Filled 2012-09-23: qty 1
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
## 2012-09-23 NOTE — ED Notes (Signed)
Resp therapy notified of order for neb treatment.
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
## 2012-09-23 NOTE — ED Notes (Signed)
Cook, MD at bedside.  

## 2012-09-25 ENCOUNTER — Encounter: Payer: Self-pay | Admitting: Obstetrics and Gynecology

## 2012-10-14 ENCOUNTER — Ambulatory Visit (INDEPENDENT_AMBULATORY_CARE_PROVIDER_SITE_OTHER): Payer: PRIVATE HEALTH INSURANCE | Admitting: Obstetrics and Gynecology

## 2012-10-14 ENCOUNTER — Encounter: Payer: Self-pay | Admitting: Obstetrics and Gynecology

## 2012-10-14 VITALS — BP 120/78 | HR 74 | Wt 154.0 lb

## 2012-10-14 DIAGNOSIS — D259 Leiomyoma of uterus, unspecified: Secondary | ICD-10-CM

## 2012-10-14 DIAGNOSIS — R350 Frequency of micturition: Secondary | ICD-10-CM

## 2012-10-14 DIAGNOSIS — D219 Benign neoplasm of connective and other soft tissue, unspecified: Secondary | ICD-10-CM

## 2012-10-14 LAB — POCT URINALYSIS DIPSTICK
Urobilinogen, UA: NEGATIVE
pH, UA: 5

## 2012-10-14 NOTE — Progress Notes (Signed)
Subjective:    Michelle Joseph is a 46 y.o. female, G1P1001, who presents for urine frequency.pt stated been going on for about a month off and on.  Known for rapidly enlarging fibroids per last ultrasound 07/2011: Uterus 12.12 x.10 x.8.89 cm with 7 measurable fibroids, largest at 5.6 and 5 cm at fundus and right lateral.  Reports: worse during cycle, can go as much as every hour or twice in an hour with nycturia X 3. No dysuria.. Incomplete emptying. No hematuria. Other weeks of the month: 4-5 times a day and once a night. Cycle every 24-26 days, 6-7 days of normal flow, no clots. Mild dysmenorrhea resolved with Ibuprofen 400 mg. Worsening PMS with moodiness. The following portions of the patient's history were reviewed and updated as appropriate: allergies, current medications, past family history.  Review of Systems Pertinent items are noted in HPI. Breast:Negative for breast lump,nipple discharge or nipple retraction Gastrointestinal: Negative for abdominal pain, change in bowel habits or rectal bleeding Urinary:negative   Objective:    BP 120/78  Pulse 74  Wt 154 lb (69.854 kg)  LMP 09/24/2012    Weight:  Wt Readings from Last 1 Encounters:  10/14/12 154 lb (69.854 kg)          BMI: There is no height on file to calculate BMI.  General Appearance: Alert, appropriate appearance for age. No acute distress Gastrointestinal: Soft, non-tender, uterine fundus at the umbilicus Pelvic Exam: Uterus: enlarged and mobile and approx. 20 weeks Rectovaginal: not indicated    Assessment:    Symptomatic fibroids with volume symptoms    Plan:    The following was reviewed with patient:    Benign nature of fibroids as well as unpredictable growth during reproductive years.      Possible fibroid symptoms including: menorrhagia, dysmenorrhea, urinary frequency,       pelvic pain, and back pain.     Expected resolution/improvement of symptoms with menopausal state.    Treatment options:  expectant management, NSAIDS, oral contraceptives,     Depo Provera, Uterine Artery Embolization, myomectomy, and hysterectomy.  Surgical approach options were also reviewed including, laparoscopic, robotically assisted and abdominal. Risks, benefits and limitations also discussed.  After 45  minutes face to face encounter, the patient leaves with written info on Depo-lupron and robotic hysterectomy. Will call back. Would recommend Depo-Lupron 3-6 months pre-op    Silverio Lay MD

## 2012-11-23 ENCOUNTER — Ambulatory Visit (INDEPENDENT_AMBULATORY_CARE_PROVIDER_SITE_OTHER): Payer: PRIVATE HEALTH INSURANCE | Admitting: Obstetrics and Gynecology

## 2012-11-23 ENCOUNTER — Encounter: Payer: Self-pay | Admitting: Obstetrics and Gynecology

## 2012-11-23 ENCOUNTER — Telehealth: Payer: Self-pay | Admitting: Obstetrics and Gynecology

## 2012-11-23 VITALS — BP 112/70 | Temp 98.9°F | Wt 159.0 lb

## 2012-11-23 DIAGNOSIS — N898 Other specified noninflammatory disorders of vagina: Secondary | ICD-10-CM

## 2012-11-23 NOTE — Telephone Encounter (Signed)
TC to pt. States has painful nodule on labia since 11/20/12. No drainage. Tender to touch.  Desires eval today. Scheduled with EP.

## 2012-11-23 NOTE — Progress Notes (Signed)
46 YO complains of  painful nodule on right outside vagina x 4 days. States that it looks like a skin tag but is very painful.  O: Pelvic: EGBUS- right edge of clitoral hood with pedunculated projection with irregular grey point- 1 mm;  removed per protocol,  at patient request after consent obtained;  with bleeding controlled with  AgNO3 and pressure.  Specimen to pathology   A: Labial Biopsy ? wart  P:  Specimen to pathology        Keep area dry x 24 hours then may soak daily x 3 days        Follow up 1 week  Lousie Calico, PA-C

## 2012-11-25 ENCOUNTER — Telehealth: Payer: Self-pay | Admitting: Obstetrics and Gynecology

## 2012-11-25 LAB — PATHOLOGY

## 2012-11-25 NOTE — Telephone Encounter (Signed)
Call to advise patient of pathology of perineal biopsy-skin tag.  Patient states that biopsy site is healing well. Breeana Sawtelle, pA-C

## 2012-12-13 ENCOUNTER — Encounter (HOSPITAL_COMMUNITY): Payer: Self-pay | Admitting: *Deleted

## 2012-12-13 ENCOUNTER — Emergency Department (HOSPITAL_COMMUNITY)
Admission: EM | Admit: 2012-12-13 | Discharge: 2012-12-13 | Disposition: A | Discharge status: Home / Self Care | Payer: PRIVATE HEALTH INSURANCE | Source: Home / Self Care

## 2012-12-13 DIAGNOSIS — B349 Viral infection, unspecified: Secondary | ICD-10-CM

## 2012-12-13 DIAGNOSIS — B9789 Other viral agents as the cause of diseases classified elsewhere: Secondary | ICD-10-CM

## 2012-12-13 NOTE — ED Notes (Signed)
Patient complains of watery eyes, cough, head and chest congestion, fever and chills x 1 week. Patient states she has been feeling rundown. Denies nausea, vomiting, diarrhea.

## 2012-12-13 NOTE — ED Provider Notes (Signed)
History     CSN: [id number]  Arrival date & time 12/13/12  1617   None     Chief Complaint  Patient presents with  . URI    (Consider location/radiation/quality/duration/timing/severity/associated sxs/prior treatment) HPI Comments: Patient patient presents with complaints of cold symptoms. Patient states that she has had approximately 3 weeks of upper respiratory symptoms include nasal congestion, drainage, sore throat for the initial 4 days, intermittent, rare self-limiting nausea and unusual fatigue.  Patient is a 46 y.o. female presenting with URI.  URI The primary symptoms include fatigue. Primary symptoms do not include fever or rash.  Symptoms associated with the illness include congestion and rhinorrhea. The illness is not associated with chills.    Past Medical History  Diagnosis Date  . Fibroids   . Herpesvirus 2   . Bulging lumbar disc   . Abnormal Pap smear   . HPV in female     Past Surgical History  Procedure Date  . Wisdom tooth extraction     Family History  Problem Relation Age of Onset  . Heart attack Father   . Cancer Mother     breast  . Hypertension Mother     History  Substance Use Topics  . Smoking status: Never Smoker   . Smokeless tobacco: Never Used  . Alcohol Use: No    OB History    Grav Para Term Preterm Abortions TAB SAB Ect Mult Living   1 1 1       1       Review of Systems  Constitutional: Positive for fatigue. Negative for fever, chills, activity change and appetite change.  HENT: Positive for congestion, rhinorrhea and postnasal drip. Negative for facial swelling, neck pain and neck stiffness.   Eyes: Negative.   Respiratory: Negative.   Cardiovascular: Negative.   Skin: Negative for pallor and rash.  Neurological: Negative.     Allergies  Eggs or egg-derived products; Corn-containing products; Milk-related compounds; Other; and Penicillins  Home Medications   Current Outpatient Rx  Name  Route  Sig  Dispense   Refill  . ALBUTEROL SULFATE HFA 108 (90 BASE) MCG/ACT IN AERS   Inhalation   Inhale 1-2 puffs into the lungs every 6 (six) hours as needed for wheezing.   1 Inhaler   0   . LORATADINE 10 MG PO TABS   Oral   Take 10 mg by mouth daily.         Marland Kitchen ONE-DAILY MULTI VITAMINS PO TABS   Oral   Take 1 tablet by mouth daily.           BP 137/90  Pulse 75  Temp 97.8 F (36.6 C) (Oral)  Resp 16  SpO2 99%  LMP 10/31/2012  Physical Exam  Nursing note and vitals reviewed. Constitutional: She is oriented to person, place, and time. She appears well-developed and well-nourished. No distress.  HENT:  Right Ear: External ear normal.  Mouth/Throat: No oropharyngeal exudate.       Bilateral TMs are normal, oropharynx is clear with scant PND and no erythema.   Eyes: EOM are normal. Pupils are equal, round, and reactive to light. Left eye exhibits no discharge.  Neck: Normal range of motion. Neck supple.  Cardiovascular: Normal rate and regular rhythm.   Pulmonary/Chest: Effort normal and breath sounds normal. No respiratory distress. She has no wheezes. She has no rales.  Abdominal: Soft. There is no tenderness.  Musculoskeletal: Normal range of motion. She exhibits no edema.  Lymphadenopathy:  She has no cervical adenopathy.  Neurological: She is alert and oriented to person, place, and time. No cranial nerve deficit.  Skin: Skin is warm and dry. No rash noted.  Psychiatric: She has a normal mood and affect.    ED Course  Procedures (including critical care time)  Labs Reviewed - No data to display No results found.   1. Viral syndrome       MDM  She appears generally well her presentation primarily involves subjective symptoms. Mostly fatigue, lack of energy. She continues to has mild drainage and nasal congestion. Her sore throat only lasted for 4 days and was minor, this was about 3 weeks ago. Exam is unremarkable. I suspect she has a viral syndrome of some type with  potentially 1 cold virus after the other one. It is suggested that she may want to see her PCP to obtain some blood work if this is persistent. For nasal congestion Sudafed PE 10 mg every 4 hours when necessary She may either continue Claritin 10 mg a day or switched to Allegra to see if that told it better. Any new symptoms problems or worsening may return. I shall stess that her physical exam is really unremarkable. She has no lymphadenopathy, or her lungs are very clear with no expiratory wheezes rhonchi or other adventitious sounds. She is not short of breath, no neurologic symptoms. Any new symptoms problems worsening may return or call your PCP for an appointment.         Hayden Rasmussen, NP 12/13/12 [phone]

## 2012-12-14 NOTE — ED Provider Notes (Signed)
Medical screening examination/treatment/procedure(s) were performed by non-physician practitioner and as supervising physician I was immediately available for consultation/collaboration.  Raynald Blend, MD 12/14/12 2033

## 2013-02-05 ENCOUNTER — Telehealth: Payer: Self-pay | Admitting: Obstetrics and Gynecology

## 2013-02-09 ENCOUNTER — Other Ambulatory Visit: Payer: Self-pay | Admitting: Obstetrics and Gynecology

## 2013-02-09 MED ORDER — LEUPROLIDE ACETATE (3 MONTH) 11.25 MG IM KIT: 11.2500 mg | PACK | INTRAMUSCULAR | Status: DC

## 2013-02-09 NOTE — Progress Notes (Signed)
TC to pt's insurance to order Lupron Depot. Rx for Lupron Depot 11.25 mg IM x 1 called to [facility] [phone]. Pt to be calle prior to shipment to our office to inform of any co-pay. Prior auth needed prior to completing order. TC to [facility] for prior auth. Needs to be done by medical prior [id number] [id number] opt 3,2,4. After giving all information was advised form needs to be downloaded and faxed with supporting clinical information. Total phone time was 32 minutes.  Unable to access web site after several attempts. TC to [place] at [phone]. States do not need form. To send office notes with cover letter stating reason for request. Info faxed to [phone].    TC to pt. INformed of process. To call when she authorizes shipment.

## 2013-02-17 ENCOUNTER — Encounter: Payer: PRIVATE HEALTH INSURANCE | Admitting: Obstetrics and Gynecology

## 2013-02-22 ENCOUNTER — Telehealth: Payer: Self-pay | Admitting: Obstetrics and Gynecology

## 2013-02-22 NOTE — Telephone Encounter (Signed)
TC to pt.  States she will call insurance to verify copay. Requests fax be held until can decide according to amount expected to pay.

## 2013-02-22 NOTE — Telephone Encounter (Signed)
Message copied by Mason Jim on Mon Feb 22, 2013  4:34 PM ------      Message from: Gardiner Ramus      Created: Fri Feb 05, 2013  2:38 PM      Regarding: Depo-Lupron       This is the patient I was telling you about.  SR said to put in the orders for Lupron and she will cosign.  I don't know how to do that.                  Surgical approach options were also reviewed including, laparoscopic, robotically assisted and abdominal. Risks, benefits and limitations also discussed.                  After 45  minutes face to face encounter, the patient leaves with written info on Depo-lupron and robotic hysterectomy. Will call back.      Would recommend Depo-Lupron 3-6 months pre-op                   ------

## 2013-02-22 NOTE — Telephone Encounter (Signed)
TC to pt. States has not received notification from pharmacy re: Lupron.  TC to [place] at [phone] Ext. [phone]. States must be ordered from [facility]. Form has not been received. Will refax. Form to be completed and sent. Is not aware if pt will be contacted prior to shipping. Will make  note on fax.

## 2013-03-03 ENCOUNTER — Telehealth: Payer: Self-pay | Admitting: Obstetrics and Gynecology

## 2013-03-03 NOTE — Telephone Encounter (Signed)
TC to pt. States has benefits meeting 03/04/13. After receives information about deductible will call about whether she wants to proceed with Depo Lupron.

## 2013-03-05 ENCOUNTER — Telehealth: Payer: Self-pay

## 2013-03-05 NOTE — Telephone Encounter (Signed)
VM from pt states that she is returning your call regarding Depo-Lupron Inj. States that her ins starts over 03/30/2013 with a $2500 deductible. States that she doesn't want to proceed with inj for that reason. Please return call.  Darien Ramus, CMA

## 2013-03-09 NOTE — Telephone Encounter (Signed)
TC to pt. Female states is out of town. LM to call when she returns.

## 2013-03-16 ENCOUNTER — Other Ambulatory Visit: Payer: Self-pay | Admitting: Obstetrics and Gynecology

## 2013-09-23 ENCOUNTER — Other Ambulatory Visit: Payer: Self-pay

## 2013-09-23 DIAGNOSIS — Z1231 Encounter for screening mammogram for malignant neoplasm of breast: Secondary | ICD-10-CM

## 2013-10-11 ENCOUNTER — Ambulatory Visit
Admission: RE | Admit: 2013-10-11 | Discharge: 2013-10-11 | Disposition: A | Discharge status: Home / Self Care | Payer: PRIVATE HEALTH INSURANCE | Source: Ambulatory Visit

## 2013-10-11 DIAGNOSIS — Z1231 Encounter for screening mammogram for malignant neoplasm of breast: Secondary | ICD-10-CM

## 2014-10-21 ENCOUNTER — Other Ambulatory Visit: Payer: Self-pay

## 2014-10-21 DIAGNOSIS — Z1231 Encounter for screening mammogram for malignant neoplasm of breast: Secondary | ICD-10-CM

## 2014-10-31 ENCOUNTER — Encounter (HOSPITAL_COMMUNITY): Payer: Self-pay | Admitting: *Deleted

## 2014-11-02 ENCOUNTER — Ambulatory Visit
Admission: RE | Admit: 2014-11-02 | Discharge: 2014-11-02 | Disposition: A | Discharge status: Home / Self Care | Payer: No Typology Code available for payment source | Source: Ambulatory Visit

## 2014-11-02 DIAGNOSIS — Z1231 Encounter for screening mammogram for malignant neoplasm of breast: Secondary | ICD-10-CM

## 2015-02-09 ENCOUNTER — Other Ambulatory Visit: Payer: Self-pay | Admitting: Obstetrics and Gynecology

## 2015-02-09 DIAGNOSIS — D259 Leiomyoma of uterus, unspecified: Secondary | ICD-10-CM

## 2015-02-21 ENCOUNTER — Ambulatory Visit
Admission: RE | Admit: 2015-02-21 | Discharge: 2015-02-21 | Disposition: A | Discharge status: Home / Self Care | Payer: No Typology Code available for payment source | Source: Ambulatory Visit | Attending: Obstetrics and Gynecology | Admitting: Obstetrics and Gynecology

## 2015-02-21 DIAGNOSIS — D259 Leiomyoma of uterus, unspecified: Secondary | ICD-10-CM

## 2015-02-21 MED ORDER — GADOBENATE DIMEGLUMINE 529 MG/ML IV SOLN
14.0000 mL | Freq: Once | INTRAVENOUS | Status: AC | PRN
  Administered 2015-02-21: 14 mL via INTRAVENOUS

## 2015-03-08 ENCOUNTER — Other Ambulatory Visit: Payer: Self-pay | Admitting: Obstetrics and Gynecology

## 2015-03-10 ENCOUNTER — Encounter (HOSPITAL_COMMUNITY)
Admission: RE | Admit: 2015-03-10 | Discharge: 2015-03-10 | Disposition: A | Discharge status: Home / Self Care | Payer: No Typology Code available for payment source | Source: Ambulatory Visit | Attending: Obstetrics and Gynecology | Admitting: Obstetrics and Gynecology

## 2015-03-10 ENCOUNTER — Encounter (HOSPITAL_COMMUNITY): Payer: Self-pay

## 2015-03-10 DIAGNOSIS — D259 Leiomyoma of uterus, unspecified: Secondary | ICD-10-CM | POA: Insufficient documentation

## 2015-03-10 DIAGNOSIS — Z01812 Encounter for preprocedural laboratory examination: Secondary | ICD-10-CM | POA: Insufficient documentation

## 2015-03-10 HISTORY — DX: Chronic obstructive pulmonary disease, unspecified: J44.9

## 2015-03-10 LAB — CBC
HEMATOCRIT: 39.1 % (ref 36.0–46.0)
Hemoglobin: 12 g/dL (ref 12.0–15.0)
MCH: 24.8 pg — ABNORMAL LOW (ref 26.0–34.0)
MCHC: 30.7 g/dL (ref 30.0–36.0)
MCV: 81 fL (ref 78.0–100.0)
Platelets: 188 10*3/uL (ref 150–400)
RBC: 4.83 MIL/uL (ref 3.87–5.11)
RDW: 14.1 % (ref 11.5–15.5)
WBC: 3.9 10*3/uL — AB (ref 4.0–10.5)

## 2015-03-10 LAB — BASIC METABOLIC PANEL
Anion gap: 7 (ref 5–15)
BUN: 9 mg/dL (ref 6–23)
CO2: 29 mmol/L (ref 19–32)
Calcium: 8.9 mg/dL (ref 8.4–10.5)
Chloride: 101 mmol/L (ref 96–112)
Creatinine, Ser: 0.88 mg/dL (ref 0.50–1.10)
GFR calc Af Amer: 89 mL/min — ABNORMAL LOW (ref >90)
GFR, EST NON AFRICAN AMERICAN: 76 mL/min — AB (ref >90)
GLUCOSE: 93 mg/dL (ref 70–99)
Potassium: 3.8 mmol/L (ref 3.5–5.1)
Sodium: 137 mmol/L (ref 135–145)

## 2015-03-10 NOTE — Patient Instructions (Signed)
   Your procedure is scheduled on: march 22 at 1015am  Enter through the [place] [facility] at: [place] up the phone at the desk and dial [phone] and inform us of your arrival.  Please call this number if you have any problems the morning of surgery: [phone]  Remember: Do not eat food after midnight:march 21 Do not drink clear liquids after: march21 Take these medicines the morning of surgery with a SIP OF WATER:  Do not wear jewelry, make-up, or FINGER nail polish No metal in your hair or on your body. Do not wear lotions, powders, perfumes.  You may wear deodorant.  Do not bring valuables to the hospital. Contacts, dentures or bridgework may not be worn into surgery.  Leave suitcase in the car. After Surgery it may be brought to your room. For patients being admitted to the hospital, checkout time is 11:00am the day of discharge.    Marland Kitchen

## 2015-03-16 ENCOUNTER — Other Ambulatory Visit (HOSPITAL_COMMUNITY): Payer: Self-pay | Admitting: Obstetrics and Gynecology

## 2015-03-16 NOTE — H&P (Incomplete)
Michelle Joseph is a 49 y.o. female  49 yo female P 1-0-0-1 presents for hysterectomy because of enlarging uterine fibroids that have become increasingly symptomatic. For over 10 years the patient has had progressively enlarging uterine fibroid with increasing mass effect symptoms.  Her menses are monthly for 7 days but only needs to change her pad twice a day.  She experiences moderate cramping rated as 6/10 on a 10 point pain scale but typically does not require analgesia.  Over the past 3 years the patient has been seen frequently for persistent and recurring urinary urgency, frequency, pelvic pressure and pelvic discomfort with few documented urinary tract infections.   She has been celibate for the past 10 years, reports no change in bowel habits and no pelvic pain except as described with her period.  On February 06, 2015 a pelvic ultrasound to follow up fibroid growth showed: uterus-15.4 x 14.4 x 11.3 cm with diffuse fibroid involvement causing the fundus to extend to the umbilicus.  Unable to discern normal myometrium beyond the cervix and unable to discern the endometrium.  There was a right adnexal structure believed to be the right ovary and the left ovary was not able to be visualized.  A follow up MRI of the abdomens and pelvis showed: uterus-12.7 x 13.6 x 10.6 cm, indistinct endometrial stripe and poorly visualized due to diffuse fibroid involvement.  The 3 largest fibroids measured: left fundal-8.2 x 6.3 x 6.9 cm; right fundal-6.3 x 5.8 x 6.5 cm,  anterior-5.5 x 4.0 x 5.6 cm  and left sided exophytic sub-serosal fibroid-2.7 cm along with several other smaller sub-serosal fibroids were seen.  Right ovary-2.9 x 2.0 x 2.7 cm ? corpus luteum and left ovary not well seen.  It was reported that no right kidney was seen and a no pelvic kidney seen.  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  A phone consult with radiologist,  Dr. Janeece Fitting  regarding the  MRI and he reiterated that there were no features of sarcoma associated with the findings on this study.  After reviewing with the patient both medical and surgical management options for her condition,  she has decided to proceed with definitive therapy in the form of hysterectomy.  Past Medical History  OB History: G 1: P 1-0-0-1; SVB 1994  GYN History: menarche: 49 YO    LMP: 02/05/2015    Contracepton abstinence  The patient reports a past history of: herpes.  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 albuterol (PROVENTIL HFA;VENTOLIN HFA) 108 (90 BASE) MCG/ACT inhaler Inhale 1-2 puffs into the lungs every 6 (six) hours as needed for wheezing.  Marland Kitchen leuprolide (LUPRON DEPOT) 11.25 MG injection Inject 11.25 mg into the muscle every 3 (three) months.  . loratadine (CLARITIN) 10 MG tablet Take 10 mg by mouth daily.  . Multiple Vitamin (MULTIVITAMIN) tablet Take 1 tablet by mouth daily.  . valACYclovir (VALTREX) 500 MG tablet TAKE ONE CAPLET BY MOUTH EVERY DAY    Allergies  Allergen Reactions  . Eggs Or Egg-Derived Products Itching  . Corn-Containing Products Itching  . Levaquin [Levofloxacin] Itching and Other (  See Comments)    Breathing difficulty and insomnia  . Milk-Related Compounds Itching  . Other     PT IS ALLERGIC TO WALNUTS,PECANS  . Penicillins Nausea And Vomiting    ROS:{dnt review of systems (ROS):20055}  Physical Exam  Bp: 116  Neck: supple without masses or thyromegaly Lungs: clear to auscultation Heart: regular rate and rhythm Abdomen: firm, non-tender mass arising from the pelvis to 3 fingers above the umbilicus on the  left-at the umbilicus otherwise Pelvic:EGBUS- wnl; vagina-normal rugae; uterus-irregular, 22-24 weeks size,  cervix without lesions or motion tenderness; adnexae-no tenderness or separable  masses though exam limited by mass effect of uterus Extremities:  no clubbing, cyanosis or edema   Assesment: Symptomatic Fibroids   Disposition:  A discussion was held with patient regarding the indication for her procedure(s) along with the risks, which include but are not limited to: reaction to anesthesia, damage to adjacent organs, infection and excessive bleeding.   CSN# [id number]   Amrit Erck J. Florene Glen, PA-C  for Dr. Dede Query. [place]

## 2015-03-16 NOTE — H&P (Signed)
Michelle Joseph is a 49 y.o. female  49 yo female P 1-0-0-1 presents for hysterectomy because of enlarging uterine fibroids that have become increasingly symptomatic. For over 10 years the patient has had progressively enlarging uterine fibroid with increasing mass effect symptoms.  Her menses are monthly for 7 days but only needs to change her pad twice a day.  She experiences moderate cramping rated as 6/10 on a 10 point pain scale but typically does not require analgesia.  Over the past 3 years the patient has been seen frequently for persistent and recurring urinary urgency, frequency, pelvic pressure and pelvic discomfort with few documented urinary tract infections.   She has been celibate for the past 10 years, reports no change in bowel habits and no pelvic pain except as described with her period.  On February 06, 2015 a pelvic ultrasound,  to follow up fibroid growth,  showed: uterus-15.4 x 14.4 x 11.3 cm with diffuse fibroid involvement causing the fundus to extend to the umbilicus.  Unable to discern normal myometrium beyond the cervix and unable to discern the endometrium.  There was a right adnexal structure believed to be the right ovary and the left ovary was not able to be visualized.  A follow up MRI of the abdomens and pelvis showed: uterus-12.7 x 13.6 x 10.6 cm, indistinct endometrial stripe and poorly visualized due to diffuse fibroid involvement.  The 3 largest fibroids measured: left fundal-8.2 x 6.3 x 6.9 cm; right fundal-6.3 x 5.8 x 6.5 cm,  anterior-5.5 x 4.0 x 5.6 cm  and left sided exophytic sub-serosal fibroid-2.7 cm along with several other smaller sub-serosal fibroids were seen.  Right ovary-2.9 x 2.0 x 2.7 cm ? corpus luteum and left ovary not well seen.  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  In a phone consult with radiologist,  Dr. Janeece Fitting,    regarding the MRI,  he reiterated that there were no features of sarcoma associated with the findings on this study.  After reviewing with the patient both medical and surgical management options for her condition,  she has decided to proceed with definitive therapy in the form of hysterectomy.  Past Medical History  OB History: G 1: P 1-0-0-1; SVB 1994  GYN History: menarche: 49 YO    LMP: 02/05/2015    Contracepton abstinence  The patient reports a past history of: herpes.  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 Eggs Or Egg-Derived Products Itching  . Corn-Containing Products Itching  . Levaquin [Levofloxacin] Itching and Other (See Comments)    Breathing difficulty and insomnia  . Milk-Related Compounds Itching  . Other     PT IS ALLERGIC TO WALNUTS,PECANS  . Penicillins Nausea And Vomiting  Pecans cause throat to close Walnuts cause itching  Denies sensitivity to  peanuts, shellfish, soy, latex or adhesives.   ROS: Admits to glasses and shortness of breath with allergies and upper respiratory illnesses, but  denies headache, vision changes, nasal congestion, dysphagia, tinnitus, dizziness, hoarseness, cough,  chest pain, nausea, vomiting, diarrhea,constipation,  urinary frequency, urgency  dysuria, hematuria, vaginitis symptoms, pelvic pain, swelling of joints,easy bruising,  myalgias, arthralgias, skin rashes, unexplained  weight loss and except as is mentioned in the history of present illness, patient's review of systems is otherwise negative.   Physical Exam  Bp: 116/62   P: 88     R: 20  Temperature: 98.4 degrees F orally  Weight: 152 lbs. Height: 5\' 9"   BMI: 22.4   Neck: supple without masses or thyromegaly Lungs: clear to auscultation Heart: regular rate and rhythm Abdomen: firm, non-tender mass arising from the pelvis to (on the left) 3 fingers above the umbilicus-remaining mass at the umbilicus. Pelvic:EGBUS- wnl; vagina-normal rugae; uterus-22-24 weeks size cervix without lesions or motion tenderness; adnexae-no tenderness or masses Extremities:  no clubbing, cyanosis or edema   Assesment: Symptomatic Uterine Fibroids   Disposition: Reviewed the risks of surgery to include, but not limited to: reaction to anesthesia, damage to adjacent organs, infection and excessive bleeding. The patient verbalized understanding of these risks and has consented to proceed with a Total Abdominal Hysterectomy with Bilateral Salpingectomy at [facility] on March 21, 2015 at 10: 15 a.m   CSN# [id number]   Tongela Encinas J. Florene Glen, PA-C  for Dr. Dede Query. Rivard

## 2015-03-20 MED ORDER — GENTAMICIN SULFATE 40 MG/ML IJ SOLN
INTRAVENOUS | Status: AC
  Administered 2015-03-21: 100 mL via INTRAVENOUS
  Filled 2015-03-20: qty 8.75

## 2015-03-21 ENCOUNTER — Inpatient Hospital Stay (HOSPITAL_COMMUNITY)
Admission: RE | Admit: 2015-03-21 | Discharge: 2015-03-23 | DRG: 743 | Disposition: A | Discharge status: Home / Self Care | Payer: No Typology Code available for payment source | Source: Ambulatory Visit | Attending: Obstetrics and Gynecology | Admitting: Obstetrics and Gynecology

## 2015-03-21 ENCOUNTER — Inpatient Hospital Stay (HOSPITAL_COMMUNITY): Payer: No Typology Code available for payment source | Admitting: Anesthesiology

## 2015-03-21 ENCOUNTER — Encounter (HOSPITAL_COMMUNITY): Payer: Self-pay

## 2015-03-21 ENCOUNTER — Encounter (HOSPITAL_COMMUNITY)
Admission: RE | Disposition: A | Discharge status: Home / Self Care | Payer: Self-pay | Source: Ambulatory Visit | Attending: Obstetrics and Gynecology

## 2015-03-21 DIAGNOSIS — Z880 Allergy status to penicillin: Secondary | ICD-10-CM | POA: Diagnosis not present

## 2015-03-21 DIAGNOSIS — Z91012 Allergy to eggs: Secondary | ICD-10-CM

## 2015-03-21 DIAGNOSIS — M5126 Other intervertebral disc displacement, lumbar region: Secondary | ICD-10-CM | POA: Diagnosis present

## 2015-03-21 DIAGNOSIS — D252 Subserosal leiomyoma of uterus: Principal | ICD-10-CM | POA: Diagnosis present

## 2015-03-21 DIAGNOSIS — R110 Nausea: Secondary | ICD-10-CM | POA: Diagnosis not present

## 2015-03-21 DIAGNOSIS — Z905 Acquired absence of kidney: Secondary | ICD-10-CM | POA: Diagnosis present

## 2015-03-21 DIAGNOSIS — B009 Herpesviral infection, unspecified: Secondary | ICD-10-CM | POA: Diagnosis present

## 2015-03-21 DIAGNOSIS — Z91018 Allergy to other foods: Secondary | ICD-10-CM | POA: Diagnosis not present

## 2015-03-21 DIAGNOSIS — J449 Chronic obstructive pulmonary disease, unspecified: Secondary | ICD-10-CM | POA: Diagnosis present

## 2015-03-21 DIAGNOSIS — Z881 Allergy status to other antibiotic agents status: Secondary | ICD-10-CM | POA: Diagnosis not present

## 2015-03-21 DIAGNOSIS — Z91011 Allergy to milk products: Secondary | ICD-10-CM | POA: Diagnosis not present

## 2015-03-21 DIAGNOSIS — D219 Benign neoplasm of connective and other soft tissue, unspecified: Secondary | ICD-10-CM | POA: Diagnosis present

## 2015-03-21 HISTORY — PX: ABDOMINAL HYSTERECTOMY: SHX81

## 2015-03-21 LAB — PREGNANCY, URINE: Preg Test, Ur: NEGATIVE

## 2015-03-21 SURGERY — HYSTERECTOMY, ABDOMINAL
Anesthesia: General | Site: Abdomen

## 2015-03-21 MED ORDER — HYDROMORPHONE HCL 1 MG/ML IJ SOLN: 0.2500 mg | INTRAMUSCULAR | Status: DC | PRN

## 2015-03-21 MED ORDER — KETOROLAC TROMETHAMINE 30 MG/ML IJ SOLN
30.0000 mg | Freq: Four times a day (QID) | INTRAMUSCULAR | Status: DC | PRN
  Administered 2015-03-21: 30 mg via INTRAVENOUS
  Filled 2015-03-21: qty 1

## 2015-03-21 MED ORDER — NALOXONE HCL 0.4 MG/ML IJ SOLN: 0.4000 mg | INTRAMUSCULAR | Status: DC | PRN

## 2015-03-21 MED ORDER — PROMETHAZINE HCL 25 MG/ML IJ SOLN
INTRAMUSCULAR | Status: AC
  Filled 2015-03-21: qty 1

## 2015-03-21 MED ORDER — ONDANSETRON HCL 4 MG/2ML IJ SOLN
4.0000 mg | Freq: Four times a day (QID) | INTRAMUSCULAR | Status: DC | PRN
  Administered 2015-03-21: 4 mg via INTRAVENOUS
  Filled 2015-03-21: qty 2

## 2015-03-21 MED ORDER — BUPIVACAINE HCL (PF) 0.25 % IJ SOLN
INTRAMUSCULAR | Status: DC | PRN
  Administered 2015-03-21: 20 mL

## 2015-03-21 MED ORDER — ONDANSETRON HCL 4 MG/2ML IJ SOLN: 4.0000 mg | Freq: Four times a day (QID) | INTRAMUSCULAR | Status: DC | PRN

## 2015-03-21 MED ORDER — ONDANSETRON HCL 4 MG/2ML IJ SOLN
4.0000 mg | Freq: Four times a day (QID) | INTRAMUSCULAR | Status: DC | PRN
Start: 2015-03-21 — End: 2015-03-22

## 2015-03-21 MED ORDER — BUPIVACAINE LIPOSOME 1.3 % IJ SUSP
20.0000 mL | Freq: Once | INTRAMUSCULAR | Status: AC
  Administered 2015-03-21: 40 mL
  Filled 2015-03-21: qty 20

## 2015-03-21 MED ORDER — DIPHENHYDRAMINE HCL 12.5 MG/5ML PO ELIX: 12.5000 mg | ORAL_SOLUTION | Freq: Four times a day (QID) | ORAL | Status: DC | PRN

## 2015-03-21 MED ORDER — ROCURONIUM BROMIDE 100 MG/10ML IV SOLN
INTRAVENOUS | Status: AC
  Filled 2015-03-21: qty 1

## 2015-03-21 MED ORDER — NEOSTIGMINE METHYLSULFATE 10 MG/10ML IV SOLN
INTRAVENOUS | Status: AC
  Filled 2015-03-21: qty 1

## 2015-03-21 MED ORDER — IBUPROFEN 600 MG PO TABS: 600.0000 mg | ORAL_TABLET | Freq: Four times a day (QID) | ORAL | Status: DC | PRN

## 2015-03-21 MED ORDER — MENTHOL 3 MG MT LOZG: 1.0000 | LOZENGE | OROMUCOSAL | Status: DC | PRN

## 2015-03-21 MED ORDER — MIDAZOLAM HCL 2 MG/2ML IJ SOLN: 0.5000 mg | Freq: Once | INTRAMUSCULAR | Status: DC | PRN

## 2015-03-21 MED ORDER — PROMETHAZINE HCL 25 MG/ML IJ SOLN
6.2500 mg | INTRAMUSCULAR | Status: DC | PRN
  Administered 2015-03-21: 12.5 mg via INTRAVENOUS

## 2015-03-21 MED ORDER — BUPIVACAINE HCL (PF) 0.25 % IJ SOLN
INTRAMUSCULAR | Status: AC
  Filled 2015-03-21: qty 30

## 2015-03-21 MED ORDER — SODIUM CHLORIDE 0.9 % IJ SOLN: 9.0000 mL | INTRAMUSCULAR | Status: DC | PRN

## 2015-03-21 MED ORDER — EPHEDRINE SULFATE 50 MG/ML IJ SOLN
INTRAMUSCULAR | Status: DC | PRN
  Administered 2015-03-21: 10 mg via INTRAVENOUS

## 2015-03-21 MED ORDER — PHENYLEPHRINE HCL 10 MG/ML IJ SOLN
INTRAMUSCULAR | Status: DC | PRN
  Administered 2015-03-21: 80 ug via INTRAVENOUS

## 2015-03-21 MED ORDER — HYDROMORPHONE 0.3 MG/ML IV SOLN
INTRAVENOUS | Status: DC
  Administered 2015-03-21: 22:00:00 via INTRAVENOUS
  Administered 2015-03-22: 2 mg via INTRAVENOUS
  Administered 2015-03-22: 0.3 mg via INTRAVENOUS
  Administered 2015-03-22: 0.6 mg via INTRAVENOUS
  Filled 2015-03-21: qty 25

## 2015-03-21 MED ORDER — NEOSTIGMINE METHYLSULFATE 10 MG/10ML IV SOLN
INTRAVENOUS | Status: DC | PRN
  Administered 2015-03-21 (×2): 2 mg via INTRAVENOUS

## 2015-03-21 MED ORDER — MIDAZOLAM HCL 2 MG/2ML IJ SOLN
INTRAMUSCULAR | Status: DC | PRN
  Administered 2015-03-21 (×2): 2 mg via INTRAVENOUS

## 2015-03-21 MED ORDER — ONDANSETRON HCL 4 MG/2ML IJ SOLN
INTRAMUSCULAR | Status: DC | PRN
  Administered 2015-03-21: 4 mg via INTRAVENOUS

## 2015-03-21 MED ORDER — GLYCOPYRROLATE 0.2 MG/ML IJ SOLN
INTRAMUSCULAR | Status: DC | PRN
  Administered 2015-03-21 (×2): 0.3 mg via INTRAVENOUS

## 2015-03-21 MED ORDER — KETOROLAC TROMETHAMINE 30 MG/ML IJ SOLN: 30.0000 mg | Freq: Once | INTRAMUSCULAR | Status: DC | PRN

## 2015-03-21 MED ORDER — MIDAZOLAM HCL 2 MG/2ML IJ SOLN
INTRAMUSCULAR | Status: AC
  Filled 2015-03-21: qty 2

## 2015-03-21 MED ORDER — LACTATED RINGERS IV SOLN
INTRAVENOUS | Status: DC
  Administered 2015-03-21 (×3): via INTRAVENOUS

## 2015-03-21 MED ORDER — ONDANSETRON HCL 4 MG/2ML IJ SOLN
INTRAMUSCULAR | Status: AC
  Filled 2015-03-21: qty 2

## 2015-03-21 MED ORDER — OXYCODONE-ACETAMINOPHEN 5-325 MG PO TABS
1.0000 | ORAL_TABLET | ORAL | Status: DC | PRN
  Administered 2015-03-22: 1 via ORAL
  Filled 2015-03-21: qty 1

## 2015-03-21 MED ORDER — SCOPOLAMINE 1 MG/3DAYS TD PT72
MEDICATED_PATCH | TRANSDERMAL | Status: AC
  Administered 2015-03-21: 1.5 mg via TRANSDERMAL
  Filled 2015-03-21: qty 1

## 2015-03-21 MED ORDER — LACTATED RINGERS IV SOLN
INTRAVENOUS | Status: DC
  Administered 2015-03-21 – 2015-03-22 (×2): via INTRAVENOUS

## 2015-03-21 MED ORDER — DEXAMETHASONE SODIUM PHOSPHATE 10 MG/ML IJ SOLN
INTRAMUSCULAR | Status: DC | PRN
  Administered 2015-03-21: 10 mg via INTRAVENOUS

## 2015-03-21 MED ORDER — FENTANYL CITRATE 0.05 MG/ML IJ SOLN
INTRAMUSCULAR | Status: AC
  Filled 2015-03-21: qty 5

## 2015-03-21 MED ORDER — FENTANYL 10 MCG/ML IV SOLN
INTRAVENOUS | Status: DC
  Administered 2015-03-21: 15:00:00 via INTRAVENOUS
  Administered 2015-03-21: 30 ug via INTRAVENOUS
  Filled 2015-03-21: qty 50

## 2015-03-21 MED ORDER — ACETAMINOPHEN 325 MG PO TABS: 325.0000 mg | ORAL_TABLET | ORAL | Status: DC | PRN

## 2015-03-21 MED ORDER — DIPHENHYDRAMINE HCL 50 MG/ML IJ SOLN: 12.5000 mg | Freq: Four times a day (QID) | INTRAMUSCULAR | Status: DC | PRN

## 2015-03-21 MED ORDER — ALBUTEROL SULFATE (2.5 MG/3ML) 0.083% IN NEBU: 3.0000 mL | INHALATION_SOLUTION | Freq: Four times a day (QID) | RESPIRATORY_TRACT | Status: DC | PRN

## 2015-03-21 MED ORDER — DIPHENHYDRAMINE HCL 50 MG/ML IJ SOLN
12.5000 mg | Freq: Four times a day (QID) | INTRAMUSCULAR | Status: DC | PRN
Start: 2015-03-21 — End: 2015-03-21

## 2015-03-21 MED ORDER — ACETAMINOPHEN 160 MG/5ML PO SOLN: 325.0000 mg | ORAL | Status: DC | PRN

## 2015-03-21 MED ORDER — DEXAMETHASONE SODIUM PHOSPHATE 10 MG/ML IJ SOLN
INTRAMUSCULAR | Status: AC
  Filled 2015-03-21: qty 1

## 2015-03-21 MED ORDER — GLYCOPYRROLATE 0.2 MG/ML IJ SOLN
INTRAMUSCULAR | Status: AC
  Filled 2015-03-21: qty 3

## 2015-03-21 MED ORDER — SODIUM CHLORIDE 0.9 % IJ SOLN
INTRAMUSCULAR | Status: AC
  Filled 2015-03-21: qty 50

## 2015-03-21 MED ORDER — SODIUM CHLORIDE 0.9 % IJ SOLN
INTRAMUSCULAR | Status: AC
  Filled 2015-03-21: qty 3

## 2015-03-21 MED ORDER — FENTANYL CITRATE 0.05 MG/ML IJ SOLN
INTRAMUSCULAR | Status: DC | PRN
  Administered 2015-03-21 (×2): 100 ug via INTRAVENOUS
  Administered 2015-03-21 (×2): 50 ug via INTRAVENOUS

## 2015-03-21 MED ORDER — FENTANYL CITRATE 0.05 MG/ML IJ SOLN
INTRAMUSCULAR | Status: AC
  Filled 2015-03-21: qty 2

## 2015-03-21 MED ORDER — HYDROMORPHONE HCL 1 MG/ML IJ SOLN
INTRAMUSCULAR | Status: AC
  Filled 2015-03-21: qty 1

## 2015-03-21 MED ORDER — SCOPOLAMINE 1 MG/3DAYS TD PT72
1.0000 | MEDICATED_PATCH | Freq: Once | TRANSDERMAL | Status: DC
  Administered 2015-03-21: 1.5 mg via TRANSDERMAL

## 2015-03-21 MED ORDER — DOCUSATE SODIUM 100 MG PO CAPS
100.0000 mg | ORAL_CAPSULE | Freq: Two times a day (BID) | ORAL | Status: DC
Start: 2015-03-22 — End: 2015-03-23
  Administered 2015-03-22 – 2015-03-23 (×2): 100 mg via ORAL
  Filled 2015-03-21 (×3): qty 1

## 2015-03-21 MED ORDER — ROCURONIUM BROMIDE 100 MG/10ML IV SOLN
INTRAVENOUS | Status: DC | PRN
  Administered 2015-03-21: 10 mg via INTRAVENOUS
  Administered 2015-03-21: 40 mg via INTRAVENOUS

## 2015-03-21 MED ORDER — MEPERIDINE HCL 25 MG/ML IJ SOLN: 6.2500 mg | INTRAMUSCULAR | Status: DC | PRN

## 2015-03-21 MED ORDER — HYDROMORPHONE HCL 1 MG/ML IJ SOLN
INTRAMUSCULAR | Status: DC | PRN
  Administered 2015-03-21 (×2): 0.5 mg via INTRAVENOUS

## 2015-03-21 MED ORDER — ONDANSETRON HCL 4 MG PO TABS: 4.0000 mg | ORAL_TABLET | Freq: Three times a day (TID) | ORAL | Status: DC | PRN

## 2015-03-21 SURGICAL SUPPLY — 41 items
APL SKNCLS STERI-STRIP NONHPOA (GAUZE/BANDAGES/DRESSINGS) ×1
BENZOIN TINCTURE PRP APPL 2/3 (GAUZE/BANDAGES/DRESSINGS) ×2 IMPLANT
CANISTER SUCT 3000ML (MISCELLANEOUS) ×2 IMPLANT
CELLS DAT CNTRL 66122 CELL SVR (MISCELLANEOUS) IMPLANT
CLOTH BEACON ORANGE TIMEOUT ST (SAFETY) ×2 IMPLANT
DECANTER SPIKE VIAL GLASS SM (MISCELLANEOUS) ×2 IMPLANT
DISSECTOR SPONGE CHERRY (GAUZE/BANDAGES/DRESSINGS) ×2 IMPLANT
DRAPE WARM FLUID 44X44 (DRAPE) ×2 IMPLANT
DRSG OPSITE POSTOP 4X10 (GAUZE/BANDAGES/DRESSINGS) ×2 IMPLANT
DURAPREP 26ML APPLICATOR (WOUND CARE) ×2 IMPLANT
GAUZE SPONGE 4X4 16PLY XRAY LF (GAUZE/BANDAGES/DRESSINGS) ×2 IMPLANT
GLOVE BIOGEL PI IND STRL 7.0 (GLOVE) ×3 IMPLANT
GLOVE BIOGEL PI INDICATOR 7.0 (GLOVE) ×3
GOWN STRL REUS W/TWL LRG LVL3 (GOWN DISPOSABLE) ×8 IMPLANT
NEEDLE HYPO 22GX1.5 SAFETY (NEEDLE) ×2 IMPLANT
NS IRRIG 1000ML POUR BTL (IV SOLUTION) ×2 IMPLANT
PACK ABDOMINAL GYN (CUSTOM PROCEDURE TRAY) ×2 IMPLANT
PAD OB MATERNITY 4.3X12.25 (PERSONAL CARE ITEMS) ×2 IMPLANT
PROTECTOR NERVE ULNAR (MISCELLANEOUS) ×4 IMPLANT
RETRACTOR WND ALEXIS 18 MED (MISCELLANEOUS) IMPLANT
RETRACTOR WND ALEXIS 25 LRG (MISCELLANEOUS) IMPLANT
RTRCTR WOUND ALEXIS 18CM MED (MISCELLANEOUS)
RTRCTR WOUND ALEXIS 25CM LRG (MISCELLANEOUS)
SPONGE LAP 18X18 X RAY DECT (DISPOSABLE) ×2 IMPLANT
STAPLER VISISTAT 35W (STAPLE) IMPLANT
STRIP CLOSURE SKIN 1/2X4 (GAUZE/BANDAGES/DRESSINGS) ×2 IMPLANT
SUT MNCRL AB 3-0 PS2 27 (SUTURE) ×2 IMPLANT
SUT PLAIN 3 0 CT 1 27 (SUTURE) IMPLANT
SUT PROLENE 3 0 SH DA (SUTURE) IMPLANT
SUT VIC AB 0 CT1 18XCR BRD8 (SUTURE) ×3 IMPLANT
SUT VIC AB 0 CT1 27 (SUTURE) ×8
SUT VIC AB 0 CT1 27XBRD ANBCTR (SUTURE) ×4 IMPLANT
SUT VIC AB 0 CT1 8-18 (SUTURE) ×6
SUT VIC AB 2-0 SH 27 (SUTURE)
SUT VIC AB 2-0 SH 27XBRD (SUTURE) IMPLANT
SUT VIC AB 3-0 SH 27 (SUTURE)
SUT VIC AB 3-0 SH 27X BRD (SUTURE) IMPLANT
SUT VICRYL 0 TIES 12 18 (SUTURE) ×2 IMPLANT
SYR CONTROL 10ML LL (SYRINGE) ×2 IMPLANT
TOWEL OR 17X24 6PK STRL BLUE (TOWEL DISPOSABLE) ×6 IMPLANT
TRAY FOLEY CATH 14FR (SET/KITS/TRAYS/PACK) ×2 IMPLANT

## 2015-03-21 NOTE — H&P (View-Only) (Signed)
Michelle Joseph is a 49 y.o. female  50 yo female P 1-0-0-1 presents for hysterectomy because of enlarging uterine fibroids that have become increasingly symptomatic. For over 10 years the patient has had progressively enlarging uterine fibroid with increasing mass effect symptoms.  Her menses are monthly for 7 days but only needs to change her pad twice a day.  She experiences moderate cramping rated as 6/10 on a 10 point pain scale but typically does not require analgesia.  Over the past 3 years the patient has been seen frequently for persistent and recurring urinary urgency, frequency, pelvic pressure and pelvic discomfort with few documented urinary tract infections.   She has been celibate for the past 10 years, reports no change in bowel habits and no pelvic pain except as described with her period.  On February 06, 2015 a pelvic ultrasound,  to follow up fibroid growth,  showed: uterus-15.4 x 14.4 x 11.3 cm with diffuse fibroid involvement causing the fundus to extend to the umbilicus.  Unable to discern normal myometrium beyond the cervix and unable to discern the endometrium.  There was a right adnexal structure believed to be the right ovary and the left ovary was not able to be visualized.  A follow up MRI of the abdomens and pelvis showed: uterus-12.7 x 13.6 x 10.6 cm, indistinct endometrial stripe and poorly visualized due to diffuse fibroid involvement.  The 3 largest fibroids measured: left fundal-8.2 x 6.3 x 6.9 cm; right fundal-6.3 x 5.8 x 6.5 cm,  anterior-5.5 x 4.0 x 5.6 cm  and left sided exophytic sub-serosal fibroid-2.7 cm along with several other smaller sub-serosal fibroids were seen.  Right ovary-2.9 x 2.0 x 2.7 cm ? corpus luteum and left ovary not well seen.  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  In a phone consult with radiologist,  Dr. Janeece Fitting,    regarding the MRI,  he reiterated that there were no features of sarcoma associated with the findings on this study.  After reviewing with the patient both medical and surgical management options for her condition,  she has decided to proceed with definitive therapy in the form of hysterectomy.  Past Medical History  OB History: G 1: P 1-0-0-1; SVB 1994  GYN History: menarche: 49 YO    LMP: 02/05/2015    Contracepton abstinence  The patient reports a past history of: herpes.  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 Eggs Or Egg-Derived Products Itching  . Corn-Containing Products Itching  . Levaquin [Levofloxacin] Itching and Other (See Comments)    Breathing difficulty and insomnia  . Milk-Related Compounds Itching  . Other     PT IS ALLERGIC TO WALNUTS,PECANS  . Penicillins Nausea And Vomiting  Pecans cause throat to close Walnuts cause itching  Denies sensitivity to  peanuts, shellfish, soy, latex or adhesives.   ROS: Admits to glasses and shortness of breath with allergies and upper respiratory illnesses, but  denies headache, vision changes, nasal congestion, dysphagia, tinnitus, dizziness, hoarseness, cough,  chest pain, nausea, vomiting, diarrhea,constipation,  urinary frequency, urgency  dysuria, hematuria, vaginitis symptoms, pelvic pain, swelling of joints,easy bruising,  myalgias, arthralgias, skin rashes, unexplained  weight loss and except as is mentioned in the history of present illness, patient's review of systems is otherwise negative.   Physical Exam  Bp: 116/62   P: 88     R: 20  Temperature: 98.4 degrees F orally  Weight: 152 lbs. Height: 5\' 9"   BMI: 22.4   Neck: supple without masses or thyromegaly Lungs: clear to auscultation Heart: regular rate and rhythm Abdomen: firm, non-tender mass arising from the pelvis to (on the left) 3 fingers above the umbilicus-remaining mass at the umbilicus. Pelvic:EGBUS- wnl; vagina-normal rugae; uterus-22-24 weeks size cervix without lesions or motion tenderness; adnexae-no tenderness or masses Extremities:  no clubbing, cyanosis or edema   Assesment: Symptomatic Uterine Fibroids   Disposition: Reviewed the risks of surgery to include, but not limited to: reaction to anesthesia, damage to adjacent organs, infection and excessive bleeding. The patient verbalized understanding of these risks and has consented to proceed with a Total Abdominal Hysterectomy with Bilateral Salpingectomy at [facility] on March 21, 2015 at 10: 15 a.m   CSN# [id number]   Lorilyn Laitinen J. Florene Glen, PA-C  for Dr. Dede Query. Rivard

## 2015-03-21 NOTE — Anesthesia Postprocedure Evaluation (Signed)
Anesthesia Post Note  Patient: Michelle Joseph  Procedure(s) Performed: Procedure(s) (LRB): HYSTERECTOMY ABDOMINAL WITH BILATERAL SALPINGECTOMY (N/A)  Anesthesia type: GA  Patient location: PACU  Post pain: Pain level controlled  Post assessment: Post-op Vital signs reviewed  Last Vitals:  Filed Vitals:   03/21/15 1430  BP:   Pulse: 95  Temp:   Resp: 14    Post vital signs: Reviewed  Level of consciousness: sedated  Complications: No apparent anesthesia complications

## 2015-03-21 NOTE — Anesthesia Preprocedure Evaluation (Addendum)
Anesthesia Evaluation  Patient identified by MRN, date of birth, ID band Patient awake    Reviewed: Allergy & Precautions, H&P , Patient's Chart, lab work & pertinent test results, reviewed documented beta blocker date and time   History of Anesthesia Complications Negative for: history of anesthetic complications  Airway Mallampati: II  TM Distance: >3 FB Neck ROM: full    Dental   Pulmonary COPD breath sounds clear to auscultation        Cardiovascular Exercise Tolerance: Good Rhythm:regular Rate:Normal     Neuro/Psych    GI/Hepatic   Endo/Other    Renal/GU      Musculoskeletal   Abdominal   Peds  Hematology   Anesthesia Other Findings   Reproductive/Obstetrics                             Anesthesia Physical Anesthesia Plan  ASA: II  Anesthesia Plan: General ETT   Post-op Pain Management:    Induction: Inhalational and Intravenous  Airway Management Planned:   Additional Equipment:   Intra-op Plan:   Post-operative Plan:   Informed Consent: I have reviewed the patients History and Physical, chart, labs and discussed the procedure including the risks, benefits and alternatives for the proposed anesthesia with the patient or authorized representative who has indicated his/her understanding and acceptance.   Dental Advisory Given  Plan Discussed with: CRNA and Surgeon  Anesthesia Plan Comments:        Anesthesia Quick Evaluation Midazolam, fentanyl, and mask induction

## 2015-03-21 NOTE — Anesthesia Postprocedure Evaluation (Signed)
Anesthesia Post Note  Patient: Michelle Joseph  Procedure(s) Performed: Procedure(s) (LRB): HYSTERECTOMY ABDOMINAL WITH BILATERAL SALPINGECTOMY (N/A)  Anesthesia type: General  Patient location: [facility]  Post pain: Pain level controlled  Post assessment: Post-op Vital signs reviewed  Last Vitals:  Filed Vitals:   03/21/15 1513  BP:   Pulse:   Temp:   Resp: 18    Post vital signs: Reviewed  Level of consciousness: sedated  Complications: No apparent anesthesia complications

## 2015-03-21 NOTE — Transfer of Care (Signed)
Immediate Anesthesia Transfer of Care Note  Patient: Michelle Joseph  Procedure(s) Performed: Procedure(s): HYSTERECTOMY ABDOMINAL WITH BILATERAL SALPINGECTOMY (N/A)  Patient Location: PACU  Anesthesia Type:General  Level of Consciousness: awake, alert  and oriented  Airway & Oxygen Therapy: Patient Spontanous Breathing and Patient connected to nasal cannula oxygen  Post-op Assessment: Report given to RN  Post vital signs: Reviewed and stable  Last Vitals:  Filed Vitals:   03/21/15 0901  BP: 140/81  Pulse: 98  Temp: 36.4 C  Resp: 16    Complications: No apparent anesthesia complications

## 2015-03-21 NOTE — Interval H&P Note (Signed)
History and Physical Interval Note:  03/21/2015 9:47 AM  Michelle Joseph  has presented today for surgery, with the diagnosis of Symptomatic Uterine Fibroids  The various methods of treatment have been discussed with the patient and family. After consideration of risks, benefits and other options for treatment, the patient has consented to  Procedure(s): HYSTERECTOMY ABDOMINAL (N/A) with bilateral salpyngectomy as a surgical intervention .  The patient's history has been reviewed, patient examined, no change in status, stable for surgery.  I have reviewed the patient's chart and labs.  Questions were answered to the patient's satisfaction.     Michelle Joseph A

## 2015-03-21 NOTE — Progress Notes (Signed)
Fentanyl PCA 10 mcg/ml, total 32ml wasted in med room sink. Witnessed by Blain Pais RN.

## 2015-03-21 NOTE — Op Note (Signed)
Preoperative diagnosis: Symptomatic uterine fibroids, absent right kidney  Postoperative diagnosis: Same  Anesthesia: Gen.  Anesthesiologist: Dr. Rudean Curt  Procedure: Total abdominal hysterectomy with bilateral salpingectomy  Surgeon: Dr. Cletis Media  Asst.: Earnstine Regal PA-C  Estimated blood loss: 300 cc  Procedure:  After being informed of the planned procedure with possible complications including but not limited to infection, bleeding, injury to other organs, informed consent was obtained and patient was taken to or #7. She was given general anesthesia with endotracheal intubation without any complication. She was placed in the dorsal decubitus position. She was prepped and draped in a sterile fashion. A Foley catheter was inserted in her bladder.  We infiltrate the midline area from umbilicus to pubic bone using 20 cc of Marcaine 0.25. We perform a vertical incision from pubic bone to umbilicus. The incision is brought sharply to the fascia. The fascia is incised in the midline fashion. Linea alba is easily identified and peritoneum is entered sharply.  Observation: Uterus is enlarged to 22 weeks size with multiple fibroids. Both tubes and ovaries are normal. Posterior cul-de-sac is free as well as anterior cul-de-sac. Appendix is visualized and normal. Liver is palpated normal. We identify one ureter on the left and no ureter on the right confirming the MRI findings of absent right kidney.  The uterus is easily exteriorized from the abdominal cavity and held and retracted with a corkscrew manipulator. We isolate the right round ligament with a Rogers forceps, sectioned at and suture it with a transfix suture of 0 Vicryl. This gives [place] entry in the broad ligament anteriorly which is sharply dissected across the lower uterine segment. We can now isolate the utero-ovarian ligament with Rogers forceps, sectioned at and suture it with a transfix suture of 0 Vicryl as well as a free tie of  0 Vicryl. The tube is then isolated with Rogers forceps and excised. This pedicle is sutured with a transfix suture of 0 Vicryl. We can then sharply and bluntly dissect the bladder down and skeletonize uterine vessels on the right. We proceed in the same fashion on the left. We first isolate the round ligament on Rogers forceps, sectioned at and suture it with a transfix suture of 0 Vicryl. The broad ligament is sharply dissected down across the lower uterine segment to complete the dissection of the bladder. We can isolate the left utero-ovarian ligament on Rogers forceps, sectioned at and suture it with a transfix suture of 0 Vicryl followed by a free tie of 0 Vicryl. The left tube is isolated on Rogers forceps, sectioned and removed. This pedicle is sutured with a transfix suture of 0 Vicryl. Uterine vessels are also skeletonize on the left. We can easily identified the left ureter and isolate the uterine artery on a Rogers forceps with the ureter under direct visitation. This pedicle is sectioned and sutured with a transfix suture of 0 Vicryl. Left cardinal ligament is isolated on a Rogers forceps, sectioned and sutured with a transfix suture of 0 Vicryl. Left uterosacral  ligament is isolated on a Rogers forceps, sectioned and sutured with a transfix suture of 0 Vicryl held for future suspension. Moving to the right we can isolate the uterine artery on a Rogers forceps, sectioned this pedicle and suture it with a transfix suture of 0 Vicryl. The right cardinal ligament is isolated on a Rogers forceps, sectioned and sutured with a transfix suture of 0 Vicryl. The right uterosacral ligament is then isolated on the Rogers forceps, sectioned and sutured  with a transfix suture of 0 Vicryl held for future suspension. Bladder is already dissected below the vaginal vault which allows [place] to isolate the vault onto Rogers forceps, open the vaginal vault with the Jorgenson scissors and remove the uterus and  entirely.  Each vaginal angle is then sutured with a transfix suture of 0 Vicryl also tied to the corresponding uterosacral ligaments. The vaginal cuff is then closed with figure-of-eight stitches of 0 Vicryl.  We complete hemostasis on the dissection of the bladder flap using a figure-of-eight stitch of 3-0 Vicryl and cauterization.  The pelvis is irrigated profusely with warm saline to confirm satisfactory hemostasis and a normal left ureter. All sponges are removed.  We proceed with the closure of the fascia using Smead Jones sutures of 0 Vicryl. Under fascia is then infiltrated with Exparel. Skin is closed with a subcuticular suture of 3-0 Monocryl and Steri-Strips. Incision is also infiltrated with Exparel for a total of 20 mg in 40 cc of saline.  Instruments and sponge count is complete 2. Estimated blood loss is 300 cc. Urine output is 150 cc. The procedure is well tolerated by the patient is taken to recovery room in a well and stable condition.  Specimen: Uterus and tubes weighing 1216 g sent to pathology

## 2015-03-21 NOTE — Addendum Note (Signed)
Addendum  created 03/21/15 1731 by Asher Muir, CRNA   Modules edited: Notes Section   Notes Section:  File: [id number]

## 2015-03-21 NOTE — Anesthesia Procedure Notes (Signed)
Procedure Name: Intubation Date/Time: 03/21/2015 10:27 AM Performed by: Karan Ramnauth, Sheron Nightingale Pre-anesthesia Checklist: Patient identified, Timeout performed, Emergency Drugs available, Suction available and Patient being monitored Patient Re-evaluated:Patient Re-evaluated prior to inductionOxygen Delivery Method: Circle system utilized Preoxygenation: Pre-oxygenation with 100% oxygen Intubation Type: IV induction Ventilation: Mask ventilation without difficulty Laryngoscope Size: Glidescope and 3 Grade View: Grade II Tube size: 7.0 mm Number of attempts: 2 Placement Confirmation: ETT inserted through vocal cords under direct vision Secured at: 21 cm Dental Injury: Teeth and Oropharynx as per pre-operative assessment  Difficulty Due To: Difficult Airway- due to immobile epiglottis

## 2015-03-22 LAB — CBC
HCT: 34.3 % — ABNORMAL LOW (ref 36.0–46.0)
HEMOGLOBIN: 11 g/dL — AB (ref 12.0–15.0)
MCH: 25.8 pg — ABNORMAL LOW (ref 26.0–34.0)
MCHC: 32.1 g/dL (ref 30.0–36.0)
MCV: 80.3 fL (ref 78.0–100.0)
Platelets: 202 10*3/uL (ref 150–400)
RBC: 4.27 MIL/uL (ref 3.87–5.11)
RDW: 14.1 % (ref 11.5–15.5)
WBC: 11.7 10*3/uL — ABNORMAL HIGH (ref 4.0–10.5)

## 2015-03-22 MED ORDER — IBUPROFEN 600 MG PO TABS
600.0000 mg | ORAL_TABLET | Freq: Four times a day (QID) | ORAL | Status: DC
  Administered 2015-03-22 – 2015-03-23 (×3): 600 mg via ORAL
  Filled 2015-03-22 (×3): qty 1

## 2015-03-22 MED ORDER — KETOROLAC TROMETHAMINE 30 MG/ML IJ SOLN
30.0000 mg | Freq: Once | INTRAMUSCULAR | Status: AC
  Administered 2015-03-22: 30 mg via INTRAVENOUS
  Filled 2015-03-22: qty 1

## 2015-03-22 NOTE — Progress Notes (Signed)
Michelle Joseph is a59 y.o.  [id number]  Post Op Date # 1:  TAH/BS  Subjective: Patient is Doing well postoperatively. Patient has The patient is not having any pain. Had an episode last night of nausea with gagging but none since.  Ambulating in the halls but has not voided yet nor taken p.o.  Objective: Vital signs in last 24 hours: Temp:  [97.5 F (36.4 C)-99.5 F (37.5 C)] 99 F (37.2 C) (03/23 0540) Pulse Rate:  [84-103] 93 (03/23 0540) Resp:  [11-24] 13 (03/23 0540) BP: (122-140)/(63-84) 130/72 mmHg (03/23 0540) SpO2:  [100 %] 100 % (03/23 0540) Weight:  [150 lb (68.04 kg)] 150 lb (68.04 kg) (03/22 1500)  Intake/Output from previous day: 03/22 0701 - 03/23 0700 In: 3305.9 [I.V.:3305.9] Out: 3200 [Urine:2850] Intake/Output this shift:    Recent Labs Lab 03/22/15 0516  WBC 11.7*  HGB 11.0*  HCT 34.3*  PLT 202    No results for input(s): NA, K, CL, CO2, BUN, CREATININE, CALCIUM, PROT, BILITOT, ALKPHOS, ALT, AST, GLUCOSE in the last 168 hours.  Invalid input(s): LABALBU  EXAM: General: alert, cooperative and no distress Resp: clear to auscultation bilaterally Cardio: regular rate and rhythm, S1, S2 normal, no murmur, click, rub or gallop GI: Bowel sounds present, dressing intact, clean and dry. Extremities: Homans sign is negative, no sign of DVT and no calf tenderness. Vaginal Bleeding: scant dry blood.   Assessment: s/p Procedure(s): HYSTERECTOMY ABDOMINAL WITH BILATERAL SALPINGECTOMY: stable and progressing well  Plan: Advance diet Encourage ambulation Advance to PO medication Routine care  LOS: 1 day    POWELL,ELMIRA, PA-C 03/22/2015 7:33 AM  Seen and agreed Feeling much better Reviewed op findings and post-op expectations Will d/c PCS and advance diet

## 2015-03-23 MED ORDER — ONDANSETRON HCL 4 MG PO TABS: 4.0000 mg | ORAL_TABLET | Freq: Three times a day (TID) | ORAL | Status: DC | PRN

## 2015-03-23 MED ORDER — OXYCODONE-ACETAMINOPHEN 5-325 MG PO TABS: 1.0000 | ORAL_TABLET | Freq: Four times a day (QID) | ORAL | Status: DC | PRN

## 2015-03-23 MED ORDER — IBUPROFEN 600 MG PO TABS: ORAL_TABLET | ORAL | Status: DC

## 2015-03-23 NOTE — Discharge Instructions (Signed)
Call [facility] @ [phone] if:  You have a temperature greater than or equal to 100.4 degrees Farenheit orally You have pain that is not made better by the pain medication given and taken as directed You have excessive bleeding or problems urinating  Take Colace (Docusate Sodium/Stool Softener) 100 mg 2-3 times daily while taking narcotic pain medicine to avoid constipation or until bowel movements are regular. Take Ibuprofen 600 mg with food every 6 hours for 5 days then as needed for pain.  You may drive after 2 weeks You may walk up steps  You may shower  You may resume a regular diet  Keep incisions clean and dry Do not lift over 15 pounds for 6 weeks Avoid anything in vagina for 6 weeks (or until after your post-operative visit)  Keep follow up appointment with Dr. Cletis Media on April 19, 2015 at 10:45 a.m.

## 2015-03-23 NOTE — Progress Notes (Signed)
Michelle Joseph is a68 y.o.  [id number]  Post Op Date # 2: TAH/BS  Subjective: Patient is Doing well postoperatively. Patient has Pain is controlled with current analgesics. Medications being used: prescription NSAID's including Ibuprofen 600 mg  and narcotic analgesics including Percocet 5/325.. Tolerating regular diet, ambulating and voiding without difficulty.   Objective: Vital signs in last 24 hours: Temp:  [98.6 F (37 C)-99.3 F (37.4 C)] 98.6 F (37 C) (03/24 0519) Pulse Rate:  [80-89] 86 (03/24 0519) Resp:  [12-18] 16 (03/24 0519) BP: (112-129)/(61-77) 112/71 mmHg (03/24 0519) SpO2:  [99 %-100 %] 99 % (03/24 0519)  Intake/Output from previous day: 03/23 0701 - 03/24 0700 In: -  Out: 600 [Urine:600] Intake/Output this shift:    Recent Labs Lab 03/22/15 0516  WBC 11.7*  HGB 11.0*  HCT 34.3*  PLT 202    No results for input(s): NA, K, CL, CO2, BUN, CREATININE, CALCIUM, PROT, BILITOT, ALKPHOS, ALT, AST, GLUCOSE in the last 168 hours.  Invalid input(s): LABALBU  EXAM: General: alert, cooperative and no distress Resp: clear to auscultation bilaterally Cardio: regular rate and rhythm, S1, S2 normal, no murmur, click, rub or gallop GI: Bowel sounds present, soft, dressing dry and intact. Extremities: Homans sign is negative, no sign of DVT and no calf tenderness.   Assessment: s/p Procedure(s): HYSTERECTOMY ABDOMINAL WITH BILATERAL SALPINGECTOMY: stable, progressing well and tolerating diet  Plan: Discharge home  LOS: 2 days    POWELL,ELMIRA, PA-C 03/23/2015 7:49 AM   Seen and agreed D/C instructions reviewed with patient

## 2015-03-23 NOTE — Discharge Summary (Signed)
Physician Discharge Summary  Patient ID: Michelle Joseph MRN: [id number] DOB/AGE: 07-20-1966 49 y.o.  Admit date: 03/21/2015 Discharge date: 03/23/2015   Discharge Diagnoses: Symptomatic Uterine Fibroids Active Problems:   Fibroids   Operation: Total Abdominal Hysterectomy with Bilateral Salpingectomy   Discharged Condition: Good  Hospital Course: On the date of admission the patient underwent the aforementioned procedures and tolerated them well.  Post operative course was unremarkable with the patient resuming bowel and bladder function by post operative day #2 and was therefore deemed ready for discharge home.  Discharge hemoglobin and hematocrit were 11.0/34.3.  Disposition: 01-Home or Self Care  Discharge Medications:    Medication List    STOP taking these medications        leuprolide 11.25 MG injection  Commonly known as:  LUPRON DEPOT      TAKE these medications        albuterol 108 (90 BASE) MCG/ACT inhaler  Commonly known as:  PROVENTIL HFA;VENTOLIN HFA  Inhale 1-2 puffs into the lungs every 6 (six) hours as needed for wheezing.     ibuprofen 600 MG tablet  Commonly known as:  ADVIL,MOTRIN  1  PO  PC EVERY 6 HOURS FOR 5 DAYS THEN PRN-PAIN     loratadine 10 MG tablet  Commonly known as:  CLARITIN  Take 10 mg by mouth daily.     multivitamin tablet  Take 1 tablet by mouth daily.     ondansetron 4 MG tablet  Commonly known as:  ZOFRAN  Take 1 tablet (4 mg total) by mouth every 8 (eight) hours as needed for nausea or vomiting.     oxyCODONE-acetaminophen 5-325 MG per tablet  Commonly known as:  PERCOCET/ROXICET  Take 1-2 tablets by mouth every 6 (six) hours as needed for severe pain (moderate to severe pain).     valACYclovir 500 MG tablet  Commonly known as:  VALTREX  TAKE ONE CAPLET BY MOUTH EVERY DAY         Follow-up: Dr. Katharine Look A. Rivard on April 19, 2015 at 10:45 a.m.  SignedEarnstine Regal, PA-C 03/23/2015, 8:00 AM

## 2015-03-23 NOTE — Progress Notes (Signed)
Pt . Is discharged in the care of Mother. Denies any pain or discomfort. Spirits are good. Discharge instructions with Rx were given to pt with good understanding.  Questions asked and answered. Abdominal incisions is clean and  Dry. Denies vaginal bleeding.

## 2015-03-24 ENCOUNTER — Encounter (HOSPITAL_COMMUNITY): Payer: Self-pay | Admitting: Obstetrics and Gynecology

## 2015-12-05 ENCOUNTER — Telehealth: Payer: Self-pay | Admitting: Cardiovascular Disease

## 2015-12-05 NOTE — Telephone Encounter (Signed)
Do not need encounter °

## 2017-02-06 ENCOUNTER — Other Ambulatory Visit: Payer: Self-pay | Admitting: Nurse Practitioner

## 2017-02-06 DIAGNOSIS — R109 Unspecified abdominal pain: Secondary | ICD-10-CM

## 2017-02-06 DIAGNOSIS — M545 Low back pain: Secondary | ICD-10-CM | POA: Diagnosis not present

## 2017-02-10 ENCOUNTER — Ambulatory Visit
Admission: RE | Admit: 2017-02-10 | Discharge: 2017-02-10 | Disposition: A | Discharge status: Home / Self Care | Payer: 59 | Source: Ambulatory Visit | Attending: Nurse Practitioner | Admitting: Nurse Practitioner

## 2017-02-10 DIAGNOSIS — R109 Unspecified abdominal pain: Secondary | ICD-10-CM | POA: Diagnosis not present

## 2017-03-06 DIAGNOSIS — R5383 Other fatigue: Secondary | ICD-10-CM | POA: Diagnosis not present

## 2017-03-06 DIAGNOSIS — J449 Chronic obstructive pulmonary disease, unspecified: Secondary | ICD-10-CM | POA: Diagnosis not present

## 2017-03-06 DIAGNOSIS — K219 Gastro-esophageal reflux disease without esophagitis: Secondary | ICD-10-CM | POA: Diagnosis not present

## 2017-03-31 DIAGNOSIS — Z1211 Encounter for screening for malignant neoplasm of colon: Secondary | ICD-10-CM | POA: Diagnosis not present

## 2017-03-31 DIAGNOSIS — K219 Gastro-esophageal reflux disease without esophagitis: Secondary | ICD-10-CM | POA: Diagnosis not present

## 2017-04-03 DIAGNOSIS — R072 Precordial pain: Secondary | ICD-10-CM | POA: Diagnosis not present

## 2017-04-03 DIAGNOSIS — L0231 Cutaneous abscess of buttock: Secondary | ICD-10-CM | POA: Diagnosis not present

## 2017-04-08 DIAGNOSIS — K219 Gastro-esophageal reflux disease without esophagitis: Secondary | ICD-10-CM | POA: Diagnosis not present

## 2017-04-08 DIAGNOSIS — K635 Polyp of colon: Secondary | ICD-10-CM | POA: Diagnosis not present

## 2017-04-08 DIAGNOSIS — D131 Benign neoplasm of stomach: Secondary | ICD-10-CM | POA: Diagnosis not present

## 2017-04-08 DIAGNOSIS — D124 Benign neoplasm of descending colon: Secondary | ICD-10-CM | POA: Diagnosis not present

## 2017-04-08 DIAGNOSIS — K317 Polyp of stomach and duodenum: Secondary | ICD-10-CM | POA: Diagnosis not present

## 2017-04-08 DIAGNOSIS — Z1211 Encounter for screening for malignant neoplasm of colon: Secondary | ICD-10-CM | POA: Diagnosis not present

## 2017-04-16 DIAGNOSIS — L0232 Furuncle of buttock: Secondary | ICD-10-CM | POA: Diagnosis not present

## 2017-04-24 DIAGNOSIS — M2550 Pain in unspecified joint: Secondary | ICD-10-CM | POA: Diagnosis not present

## 2017-04-24 DIAGNOSIS — J029 Acute pharyngitis, unspecified: Secondary | ICD-10-CM | POA: Diagnosis not present

## 2017-06-03 DIAGNOSIS — R350 Frequency of micturition: Secondary | ICD-10-CM | POA: Diagnosis not present

## 2017-06-03 DIAGNOSIS — N390 Urinary tract infection, site not specified: Secondary | ICD-10-CM | POA: Diagnosis not present

## 2017-08-18 DIAGNOSIS — R002 Palpitations: Secondary | ICD-10-CM | POA: Diagnosis not present

## 2017-08-18 DIAGNOSIS — R030 Elevated blood-pressure reading, without diagnosis of hypertension: Secondary | ICD-10-CM | POA: Diagnosis not present

## 2017-08-18 DIAGNOSIS — R079 Chest pain, unspecified: Secondary | ICD-10-CM | POA: Diagnosis not present

## 2017-08-26 DIAGNOSIS — R0789 Other chest pain: Secondary | ICD-10-CM | POA: Diagnosis not present

## 2017-08-26 DIAGNOSIS — I10 Essential (primary) hypertension: Secondary | ICD-10-CM | POA: Diagnosis not present

## 2017-08-26 DIAGNOSIS — R002 Palpitations: Secondary | ICD-10-CM | POA: Diagnosis not present

## 2017-10-01 DIAGNOSIS — R002 Palpitations: Secondary | ICD-10-CM | POA: Diagnosis not present

## 2017-10-01 DIAGNOSIS — I10 Essential (primary) hypertension: Secondary | ICD-10-CM | POA: Diagnosis not present

## 2017-10-01 DIAGNOSIS — R0789 Other chest pain: Secondary | ICD-10-CM | POA: Diagnosis not present

## 2017-10-23 DIAGNOSIS — Z23 Encounter for immunization: Secondary | ICD-10-CM | POA: Diagnosis not present

## 2017-11-04 DIAGNOSIS — E785 Hyperlipidemia, unspecified: Secondary | ICD-10-CM | POA: Diagnosis not present

## 2017-11-04 DIAGNOSIS — I10 Essential (primary) hypertension: Secondary | ICD-10-CM | POA: Diagnosis not present

## 2017-11-04 DIAGNOSIS — R002 Palpitations: Secondary | ICD-10-CM | POA: Diagnosis not present

## 2017-12-22 ENCOUNTER — Emergency Department (HOSPITAL_COMMUNITY): Payer: 59

## 2017-12-22 ENCOUNTER — Emergency Department (HOSPITAL_COMMUNITY)
Admission: EM | Admit: 2017-12-22 | Discharge: 2017-12-22 | Disposition: A | Discharge status: Home / Self Care | Payer: 59 | Attending: Emergency Medicine | Admitting: Emergency Medicine

## 2017-12-22 ENCOUNTER — Other Ambulatory Visit: Payer: Self-pay

## 2017-12-22 ENCOUNTER — Encounter (HOSPITAL_COMMUNITY): Payer: Self-pay | Admitting: Nurse Practitioner

## 2017-12-22 DIAGNOSIS — J449 Chronic obstructive pulmonary disease, unspecified: Secondary | ICD-10-CM | POA: Insufficient documentation

## 2017-12-22 DIAGNOSIS — R000 Tachycardia, unspecified: Secondary | ICD-10-CM

## 2017-12-22 DIAGNOSIS — Z79899 Other long term (current) drug therapy: Secondary | ICD-10-CM | POA: Insufficient documentation

## 2017-12-22 DIAGNOSIS — R002 Palpitations: Secondary | ICD-10-CM | POA: Insufficient documentation

## 2017-12-22 DIAGNOSIS — R079 Chest pain, unspecified: Secondary | ICD-10-CM | POA: Diagnosis present

## 2017-12-22 DIAGNOSIS — R0789 Other chest pain: Secondary | ICD-10-CM | POA: Diagnosis not present

## 2017-12-22 LAB — I-STAT CHEM 8, ED
BUN: 5 mg/dL — AB (ref 6–20)
CALCIUM ION: 1.16 mmol/L (ref 1.15–1.40)
CHLORIDE: 104 mmol/L (ref 101–111)
CREATININE: 0.9 mg/dL (ref 0.44–1.00)
GLUCOSE: 92 mg/dL (ref 65–99)
HCT: 38 % (ref 36.0–46.0)
Hemoglobin: 12.9 g/dL (ref 12.0–15.0)
Potassium: 3.6 mmol/L (ref 3.5–5.1)
Sodium: 143 mmol/L (ref 135–145)
TCO2: 27 mmol/L (ref 22–32)

## 2017-12-22 LAB — BASIC METABOLIC PANEL
Anion gap: 8 (ref 5–15)
BUN: 5 mg/dL — ABNORMAL LOW (ref 6–20)
CALCIUM: 9.5 mg/dL (ref 8.9–10.3)
CO2: 28 mmol/L (ref 22–32)
CREATININE: 0.92 mg/dL (ref 0.44–1.00)
Chloride: 105 mmol/L (ref 101–111)
GFR calc non Af Amer: >60 mL/min (ref >60)
GLUCOSE: 96 mg/dL (ref 65–99)
Potassium: 3.6 mmol/L (ref 3.5–5.1)
Sodium: 141 mmol/L (ref 135–145)

## 2017-12-22 LAB — I-STAT BETA HCG BLOOD, ED (MC, WL, AP ONLY): I-stat hCG, quantitative: <5 m[IU]/mL (ref <5)

## 2017-12-22 LAB — CBC
HCT: 40.2 % (ref 36.0–46.0)
Hemoglobin: 12.6 g/dL (ref 12.0–15.0)
MCH: 25.2 pg — AB (ref 26.0–34.0)
MCHC: 31.3 g/dL (ref 30.0–36.0)
MCV: 80.4 fL (ref 78.0–100.0)
PLATELETS: 233 10*3/uL (ref 150–400)
RBC: 5 MIL/uL (ref 3.87–5.11)
RDW: 13.5 % (ref 11.5–15.5)
WBC: 5.9 10*3/uL (ref 4.0–10.5)

## 2017-12-22 LAB — I-STAT TROPONIN, ED: TROPONIN I, POC: 0 ng/mL (ref 0.00–0.08)

## 2017-12-22 MED ORDER — IOPAMIDOL (ISOVUE-370) INJECTION 76%
INTRAVENOUS | Status: AC
  Administered 2017-12-22: 50 mL
  Filled 2017-12-22: qty 100

## 2017-12-22 MED ORDER — METOPROLOL TARTRATE 25 MG PO TABS
12.5000 mg | ORAL_TABLET | Freq: Once | ORAL | Status: AC
  Administered 2017-12-22: 12.5 mg via ORAL
  Filled 2017-12-22: qty 1

## 2017-12-22 MED ORDER — METOPROLOL TARTRATE 5 MG/5ML IV SOLN
5.0000 mg | Freq: Once | INTRAVENOUS | Status: DC
  Filled 2017-12-22: qty 5

## 2017-12-22 NOTE — Discharge Instructions (Signed)
Please take your metoprolol as prescribed by Dr. Terrence Dupont at night.   See Dr. Terrence Dupont this Wednesday around 9 am (in 2 days)   Return to ER if you have worse palpitations, chest pain, trouble breathing.

## 2017-12-22 NOTE — ED Provider Notes (Signed)
[facility] EMERGENCY DEPARTMENT Provider Note   CSN: [id number] Arrival date & time: 12/22/17  2035     History   Chief Complaint Chief Complaint  Patient presents with  . Chest Pain    HPI Michelle Joseph is a 51 y.o. female history of COPD, who presented with palpitations, chest pain, shortness of breath.  Patient states that she has been having shortness of breath going on for the last month or so.  She has seen Dr. Terrence Dupont and had an echo in the office about a week ago that showed trace mitral regurg.  Patient states that last night, she had worsening palpitations and shortness of breath and was unable to sleep.  She was prescribed metoprolol and she picked it up but didn't take it. Denies any chest pain currently.  Patient was brought in from triage due to significant EKG changes.   The history is provided by the patient.    Past Medical History:  Diagnosis Date  . Abnormal Pap smear   . Bulging lumbar disc   . COPD (chronic obstructive pulmonary disease) ([place])    several years ago.. resolved  . Fibroids   . Herpesvirus 2   . HPV in female     Patient Active Problem List   Diagnosis Date Noted  . 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 Fibroids 04/28/2012  . Bulging lumbar disc 04/28/2012    Past Surgical History:  Procedure Laterality Date  . ABDOMINAL HYSTERECTOMY N/A 03/21/2015   Procedure: HYSTERECTOMY ABDOMINAL WITH BILATERAL SALPINGECTOMY;  Surgeon: Delsa Bern, MD;  Location: [facility];  Service: Gynecology;  Laterality: N/A;  . WISDOM TOOTH EXTRACTION      OB History    Gravida Para Term Preterm AB Living   1 1 1     1    SAB TAB Ectopic Multiple Live Births                   Home Medications    Prior to Admission medications   Medication Sig Start Date End Date Taking? Authorizing Provider  albuterol (PROVENTIL HFA;VENTOLIN HFA) 108 (90 BASE) MCG/ACT inhaler Inhale 1-2 puffs into the lungs every 6 (six) hours as needed for wheezing.  06/01/12 12/22/17 Yes Harden Mo, MD  Esomeprazole Magnesium (NEXIUM PO) Take 2 tablets by mouth daily as needed.   Yes [provider]  loratadine (CLARITIN) 10 MG tablet Take 10 mg by mouth daily as needed.    Yes [provider]  ibuprofen (ADVIL,MOTRIN) 600 MG tablet 1  PO  PC EVERY 6 HOURS FOR 5 DAYS THEN PRN-PAIN Patient not taking: Reported on 12/22/2017 03/23/15   Earnstine Regal, PA-C  metoprolol succinate (TOPROL-XL) 50 MG 24 hr tablet Take 50 mg by mouth daily. 12/22/17   [provider]  ondansetron (ZOFRAN) 4 MG tablet Take 1 tablet (4 mg total) by mouth every 8 (eight) hours as needed for nausea or vomiting. Patient not taking: Reported on 12/22/2017 03/23/15   Earnstine Regal, PA-C  oxyCODONE-acetaminophen (PERCOCET/ROXICET) 5-325 MG per tablet Take 1-2 tablets by mouth every 6 (six) hours as needed for severe pain (moderate to severe pain). Patient not taking: Reported on 12/22/2017 03/23/15   Earnstine Regal, PA-C  valACYclovir (VALTREX) 500 MG tablet TAKE ONE CAPLET BY MOUTH EVERY DAY Patient not taking: Reported on 12/22/2017 03/16/13   Ena Dawley, MD    Family History Family History  Problem Relation Age of Onset  . Heart attack Father   . Cancer Mother  breast  . Hypertension Mother     Social History Social History   Tobacco Use  . Smoking status: Never Smoker  . Smokeless tobacco: Never Used  Substance Use Topics  . Alcohol use: No  . Drug use: No     Allergies   Eggs or egg-derived products; Corn-containing products; Levaquin [levofloxacin]; Milk-related compounds; Other; Penicillins; and Sulfa antibiotics   Review of Systems Review of Systems  Respiratory: Positive for shortness of breath.   Cardiovascular: Positive for palpitations.  All other systems reviewed and are negative.    Physical Exam Updated Vital Signs BP 131/84   Pulse 93   Temp 98.4 F (36.9 C) (Oral)   Resp 12   Ht 5\' 9"  (1.753 m)   Wt  71.2 kg (157 lb)   SpO2 100%   BMI 23.18 kg/m   Physical Exam  Constitutional: She is oriented to person, place, and time. She appears well-developed.  Slightly anxious  HENT:  Head: Normocephalic.  Eyes: Pupils are equal, round, and reactive to light.  Neck: Normal range of motion.  Cardiovascular: Regular rhythm and normal pulses. Tachycardia present.  Pulmonary/Chest: Effort normal.  Abdominal: Soft. Bowel sounds are normal.  Musculoskeletal: Normal range of motion.  Neurological: She is alert and oriented to person, place, and time.  Skin: Skin is warm.  Psychiatric: She has a normal mood and affect. Her behavior is normal.  Nursing note and vitals reviewed.    ED Treatments / Results  Labs (all labs ordered are listed, but only abnormal results are displayed) Labs Reviewed  BASIC METABOLIC PANEL - Abnormal; Notable for the following components:      Result Value   BUN 5 (*)    All other components within normal limits  CBC - Abnormal; Notable for the following components:   MCH 25.2 (*)    All other components within normal limits  I-STAT CHEM 8, ED - Abnormal; Notable for the following components:   BUN 5 (*)    All other components within normal limits  TSH  I-STAT TROPONIN, ED  I-STAT BETA HCG BLOOD, ED ([facility], [facility], [facility] ONLY)    EKG  EKG Interpretation  Date/Time:  Monday December 22 2017 21:00:11 EST Ventricular Rate:  98 PR Interval:  162 QRS Duration: 130 QT Interval:  409 QTC Calculation: 523 R Axis:   55 Text Interpretation:  Sinus rhythm LAE, consider biatrial enlargement IVCD, consider atypical LBBB No significant change since last tracing since earlier in the day Confirmed by Wandra Arthurs ([id number]) on 12/22/2017 9:52:25 PM       Radiology Dg Chest 2 View  Result Date: 12/22/2017 CLINICAL DATA:  51 year old female with chest pain. EXAM: CHEST  2 VIEW COMPARISON:  Chest radiograph dated 07/22/2012 FINDINGS: The heart size and mediastinal contours  are within normal limits. Both lungs are clear. The visualized skeletal structures are unremarkable. IMPRESSION: No active cardiopulmonary disease. Electronically Signed   By: Anner Crete M.D.   On: 12/22/2017 21:27   Ct Angio Chest Pe W And/or Wo Contrast  Result Date: 12/22/2017 CLINICAL DATA:  Acute onset of central right-sided chest pain and fluttering sensation in the chest. Lightheadedness and generalized weakness. EXAM: CT ANGIOGRAPHY CHEST WITH CONTRAST TECHNIQUE: Multidetector CT imaging of the chest was performed using the standard protocol during bolus administration of intravenous contrast. Multiplanar CT image reconstructions and MIPs were obtained to evaluate the vascular anatomy. CONTRAST:  97mL ISOVUE-370 IOPAMIDOL (ISOVUE-370) INJECTION 76% COMPARISON:  Chest radiograph performed earlier  today at 9:09 p.m. FINDINGS: Cardiovascular:  There is no evidence of pulmonary embolus. The heart is normal in size. The thoracic aorta is unremarkable. The great vessels are within normal limits. Mediastinum/Nodes: The mediastinum is unremarkable. No mediastinal lymphadenopathy is seen. No pericardial effusion is identified. The visualized portions of the thyroid gland are unremarkable. No axillary lymphadenopathy is appreciated. Lungs/Pleura: The lungs are clear bilaterally. No focal consolidation, pleural effusion or pneumothorax is seen. No masses are identified. Upper Abdomen: The visualized portions of the liver and spleen are unremarkable. The visualized portions of the gallbladder, pancreas, adrenal glands and left kidney are within normal limits. Musculoskeletal: No acute osseous abnormalities are identified. The visualized musculature is unremarkable in appearance. Review of the MIP images confirms the above findings. IMPRESSION: No evidence of pulmonary embolus.  Lungs clear bilaterally. Electronically Signed   By: Garald Balding M.D.   On: 12/22/2017 22:19    Procedures Procedures  (including critical care time)  Medications Ordered in ED Medications  metoprolol tartrate (LOPRESSOR) injection 5 mg (5 mg Intravenous Not Given 12/22/17 2305)  iopamidol (ISOVUE-370) 76 % injection (50 mLs  Contrast Given 12/22/17 2155)  metoprolol tartrate (LOPRESSOR) tablet 12.5 mg (12.5 mg Oral Given 12/22/17 2313)     Initial Impression / Assessment and Plan / ED Course  I have reviewed the triage vital signs and the nursing notes.  Pertinent labs & imaging results that were available during my care of the patient were reviewed by me and considered in my medical decision making (see chart for details).     Asyria Kolander is a 51 y.o. female here with palpitations, shortness of breath for months. Has new onset LBBB but last EKG was in 2013. Patient follows up with Dr. Terrence Dupont. Consider PE, low risk for ACS. Symptoms for 24 hrs so trop x1 sufficient. Will get labs, CTA PE, trop x 1.  11:14 PM Trop negative. Labs unremarkable. CTA showed no PE. I called Dr. Terrence Dupont. He doesn't remember if her recent EKG had LBBB or not. Her HR is down to 80s-90s now. He will see patient in 2 days for follow up. I told her to take metoprolol as prescribed by Dr. Terrence Dupont.     Final Clinical Impressions(s) / ED Diagnoses   Final diagnoses:  None    ED Discharge Orders    None       Drenda Freeze, MD 12/22/17 2314

## 2017-12-22 NOTE — ED Triage Notes (Addendum)
Pt endorses central right chest pain, heart fluttering sensations, lightheadedness and generalized weakness ongoing but worst last night. Endorses bilateral hand and feet numbness and tingling Noted HTN when she checked at [facility] an hour a go -174/80 - went to pick up her metoprolol that she was supposed to start today. Follows up with Dr. Ophelia Charter- had ECHO dx with "leaky valve"

## 2017-12-24 ENCOUNTER — Other Ambulatory Visit: Payer: Self-pay | Admitting: Cardiology

## 2017-12-24 DIAGNOSIS — E785 Hyperlipidemia, unspecified: Secondary | ICD-10-CM | POA: Diagnosis not present

## 2017-12-24 DIAGNOSIS — I10 Essential (primary) hypertension: Secondary | ICD-10-CM | POA: Diagnosis not present

## 2017-12-24 DIAGNOSIS — R0789 Other chest pain: Secondary | ICD-10-CM | POA: Diagnosis not present

## 2017-12-24 DIAGNOSIS — R079 Chest pain, unspecified: Secondary | ICD-10-CM

## 2017-12-31 ENCOUNTER — Encounter (HOSPITAL_COMMUNITY)
Admission: RE | Admit: 2017-12-31 | Discharge: 2017-12-31 | Disposition: A | Discharge status: Home / Self Care | Payer: 59 | Source: Ambulatory Visit | Attending: Cardiology | Admitting: Cardiology

## 2017-12-31 ENCOUNTER — Encounter (HOSPITAL_COMMUNITY): Payer: Self-pay | Admitting: Radiology

## 2017-12-31 DIAGNOSIS — R0789 Other chest pain: Secondary | ICD-10-CM | POA: Diagnosis not present

## 2017-12-31 DIAGNOSIS — I10 Essential (primary) hypertension: Secondary | ICD-10-CM | POA: Diagnosis not present

## 2017-12-31 DIAGNOSIS — R079 Chest pain, unspecified: Secondary | ICD-10-CM | POA: Diagnosis present

## 2017-12-31 DIAGNOSIS — R9431 Abnormal electrocardiogram [ECG] [EKG]: Secondary | ICD-10-CM | POA: Diagnosis not present

## 2017-12-31 MED ORDER — REGADENOSON 0.4 MG/5ML IV SOLN
0.4000 mg | Freq: Once | INTRAVENOUS | Status: AC
  Administered 2017-12-31: 0.4 mg via INTRAVENOUS

## 2017-12-31 MED ORDER — REGADENOSON 0.4 MG/5ML IV SOLN
INTRAVENOUS | Status: AC
  Administered 2017-12-31: 0.4 mg via INTRAVENOUS
  Filled 2017-12-31: qty 5

## 2017-12-31 MED ORDER — TECHNETIUM TC 99M TETROFOSMIN IV KIT
30.0000 | PACK | Freq: Once | INTRAVENOUS | Status: AC | PRN
  Administered 2017-12-31: 30 via INTRAVENOUS

## 2017-12-31 MED ORDER — TECHNETIUM TC 99M TETROFOSMIN IV KIT
10.0000 | PACK | Freq: Once | INTRAVENOUS | Status: AC | PRN
  Administered 2017-12-31: 10 via INTRAVENOUS

## 2018-01-07 DIAGNOSIS — I10 Essential (primary) hypertension: Secondary | ICD-10-CM | POA: Diagnosis not present

## 2018-01-07 DIAGNOSIS — R002 Palpitations: Secondary | ICD-10-CM | POA: Diagnosis not present

## 2018-01-07 DIAGNOSIS — E785 Hyperlipidemia, unspecified: Secondary | ICD-10-CM | POA: Diagnosis not present

## 2018-02-24 ENCOUNTER — Telehealth: Payer: Self-pay

## 2018-02-24 NOTE — Telephone Encounter (Signed)
SENT REFERRAL TO SCHEDULING 

## 2018-03-02 DIAGNOSIS — R002 Palpitations: Secondary | ICD-10-CM | POA: Diagnosis not present

## 2018-03-02 DIAGNOSIS — E785 Hyperlipidemia, unspecified: Secondary | ICD-10-CM | POA: Diagnosis not present

## 2018-03-02 DIAGNOSIS — I10 Essential (primary) hypertension: Secondary | ICD-10-CM | POA: Diagnosis not present

## 2018-03-03 DIAGNOSIS — E785 Hyperlipidemia, unspecified: Secondary | ICD-10-CM | POA: Diagnosis not present

## 2018-03-03 DIAGNOSIS — I10 Essential (primary) hypertension: Secondary | ICD-10-CM | POA: Diagnosis not present

## 2018-03-03 DIAGNOSIS — R002 Palpitations: Secondary | ICD-10-CM | POA: Diagnosis not present

## 2018-03-19 NOTE — Progress Notes (Signed)
Cardiology Office Note    Date:  03/20/2018   ID:  Michelle Joseph, DOB April 06, 1966, MRN [id number]  PCP:  Glendale Chard, MD  Cardiologist: Sinclair Grooms, MD   Chief Complaint  Patient presents with  . Irregular Heart Beat    Rapid palpitations    History of Present Illness:  Michelle Joseph is a 52 y.o. female referred by Dr. Glendale Chard for evaluation of palpitations and chest pain.  Michelle Joseph is a daughter of Mr.Williiam Bautch who has CAD and calcific aortic stenosis.  He is deceased now for approximately 2 years.  Michelle Joseph is a patient of Dr. Terrence Dupont and has been having difficulty with unpredictable episodes of rapid fast heart beating.  Michelle Joseph has had difficulty sleeping and frequently awakens with her heart racing.  An evaluation by Dr. Terrence Dupont has included an echocardiogram which was essentially normal and also a pharmacologic nuclear study which was low risk.  LV function reveals EF greater than 60%.  No significant structural abnormalities.  Trivial tricuspid and mild mitral regurgitation are present.  Michelle Joseph has no risk factors for coronary disease other than her father's history.  There is no history of hypertension, tobacco use, diabetes, or illicit substance use.  Dr. Terrence Dupont tried to do monitoring to establish a diagnosis.  Michelle Joseph attempted to wear a monitor is with multiple different leads but had significant skin reactions.  He then started low-dose metoprolol therapy which has led to some improvement although Michelle Joseph feels tight in the chest when Michelle Joseph uses metoprolol because of her history of asthma.  Michelle Joseph is better but still has significant anxiety concerning the racing heart and is not sleeping.  EKG December 2018 revealed left bundle branch block which was a new finding.   Past Medical History:  Diagnosis Date  . Abnormal Pap smear   . Bulging lumbar disc   . Bulging lumbar disc   . Chest pain   . COPD (chronic obstructive pulmonary disease) ([place])    several years ago..  resolved  . Fibroids   . GERD (gastroesophageal reflux disease)   . 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 Hypertension   . Iron deficiency anemia   . Pain in back   . Postmenopausal    AGE 71  . Pure hypercholesterolemia, unspecified   . Seasonal allergies   . Vitamin D deficiency     Past Surgical History:  Procedure Laterality Date  . ABDOMINAL HYSTERECTOMY N/A 03/21/2015   Procedure: HYSTERECTOMY ABDOMINAL WITH BILATERAL SALPINGECTOMY;  Surgeon: Delsa Bern, MD;  Location: [facility];  Service: Gynecology;  Laterality: N/A;  . WISDOM TOOTH EXTRACTION      Current Medications: Outpatient Medications Prior to Visit  Medication Sig Dispense Refill  . albuterol (PROVENTIL HFA;VENTOLIN HFA) 108 (90 Base) MCG/ACT inhaler Inhale 1-2 puffs into the lungs every 6 (six) hours as needed for wheezing or shortness of breath.    Marland Kitchen atorvastatin (LIPITOR) 20 MG tablet Take 20 mg by mouth daily.    . Esomeprazole Magnesium (NEXIUM PO) Take 2 tablets by mouth daily.     Marland Kitchen ibuprofen (ADVIL,MOTRIN) 600 MG tablet Take 600 mg by mouth every 6 (six) hours as needed for moderate pain.    Marland Kitchen loratadine (CLARITIN) 10 MG tablet Take 10 mg by mouth daily as needed for allergies.     . valACYclovir (VALTREX) 500 MG tablet TAKE ONE CAPLET BY MOUTH EVERY DAY 30 tablet 0  . amLODipine (NORVASC) 5 MG tablet Take 5  mg by mouth daily.    . metoprolol succinate (TOPROL-XL) 50 MG 24 hr tablet Take 50 mg by mouth daily.    Marland Kitchen albuterol (PROVENTIL HFA;VENTOLIN HFA) 108 (90 BASE) MCG/ACT inhaler Inhale 1-2 puffs into the lungs every 6 (six) hours as needed for wheezing. 1 Inhaler 0  . ibuprofen (ADVIL,MOTRIN) 600 MG tablet 1  PO  PC EVERY 6 HOURS FOR 5 DAYS THEN PRN-PAIN (Patient not taking: Reported on 12/22/2017) 30 tablet 1  . ondansetron (ZOFRAN) 4 MG tablet Take 1 tablet (4 mg total) by mouth every 8 (eight) hours as needed for nausea or vomiting. (Patient not taking: Reported on 12/22/2017) 20 tablet 0  .  oxyCODONE-acetaminophen (PERCOCET/ROXICET) 5-325 MG per tablet Take 1-2 tablets by mouth every 6 (six) hours as needed for severe pain (moderate to severe pain). (Patient not taking: Reported on 12/22/2017) 30 tablet 0   No facility-administered medications prior to visit.      Allergies:   Eggs or egg-derived products; Corn-containing products; Levaquin [levofloxacin]; Milk-related compounds; Other; Penicillins; and Sulfa antibiotics   Social History   Socioeconomic History  . Marital status: Single    Spouse name: Not on file  . Number of children: Not on file  . Years of education: Not on file  . Highest education level: Not on file  Occupational History  . Not on file  Social Needs  . Financial resource strain: Not on file  . Food insecurity:    Worry: Not on file    Inability: Not on file  . Transportation needs:    Medical: Not on file    Non-medical: Not on file  Tobacco Use  . Smoking status: Never Smoker  . Smokeless tobacco: Never Used  Substance and Sexual Activity  . Alcohol use: No  . Drug use: No  . Sexual activity: Not Currently    Partners: Male    Birth control/protection: None    Comment: ABSTINENCE FOR 8 YEARS  Lifestyle  . Physical activity:    Days per week: Not on file    Minutes per session: Not on file  . Stress: Not on file  Relationships  . Social connections:    Talks on phone: Not on file    Gets together: Not on file    Attends religious service: Not on file    Active member of club or organization: Not on file    Attends meetings of clubs or organizations: Not on file    Relationship status: Not on file  Other Topics Concern  . Not on file  Social History Narrative  . Not on file     Family History:  The patient's family history includes Cancer in her mother; Heart attack in her father; Heart murmur in her father; Hypertension in her father and mother.   ROS:   Please see the history of present illness.    Somewhat positive with  a history of chest pressure, irregular heartbeat, nausea, diarrhea, muscle pain, dizziness, insomnia, and some vision disturbance. All other systems reviewed and are negative.   PHYSICAL EXAM:   VS:  BP 122/78   Pulse 73   Ht 5\' 9"  (1.753 m)   Wt 158 lb (71.7 kg)   LMP 02/05/2015   BMI 23.33 kg/m    GEN: Well nourished, well developed, in no acute distress  HEENT: normal  Neck: no JVD, carotid bruits, or masses Cardiac: 1-2/6 left parasternal systolic murmur compatible with mitral regurgitation.  RRR; no rubs, or gallops,no  edema. Respiratory:  clear to auscultation bilaterally, normal work of breathing GI: soft, nontender, nondistended, + BS MS: no deformity or atrophy  Skin: warm and dry, no rash Neuro:  Alert and Oriented x 3, Strength and sensation are intact Psych: euthymic mood, full affect  Wt Readings from Last 3 Encounters:  03/20/18 158 lb (71.7 kg)  12/22/17 157 lb (71.2 kg)  03/21/15 150 lb (68 kg)      Studies/Labs Reviewed:   EKG:  EKG normal sinus rhythm with PR interval 156 ms.  Otherwise normal.  Resolution of left bundle branch block which was present December 22, 2017.  Recent Labs: 12/22/2017: BUN 5; Creatinine, Ser 0.90; Hemoglobin 12.9; Platelets 233; Potassium 3.6; Sodium 143   Lipid Panel No results found for: CHOL, TRIG, HDL, CHOLHDL, VLDL, LDLCALC, LDLDIRECT  Additional studies/ records that were reviewed today include:  Pharmacologic nuclear study 12/31/17:  No reversible ischemia or evidence of infarction  Normal wall motion  Ejection fraction 75%  Low risk  2D Doppler echocardiogram October 2018:  Normal LV size and function, EF 55-60%.  Normal right ventricle size and function.    Normal left atrial size  Mild mitral regurgitation without prolapse  Trace tricuspid regurgitation  Normal aorta    ASSESSMENT:    1. Palpitations   2. Chest pain, unspecified type   3. Left bundle branch block      PLAN:  In order of  problems listed above:  1. 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  Michelle Joseph has achieved some symptomatic improvement with low-dose beta-blocker therapy but unfortunately chest tightness and wheezing on 25 mg of metoprolol succinate per day.  Since we are unable to document the arrhythmia by monitoring, I agree with Dr. Zenia Resides approach of empiric therapy directed at reentrant tachycardias.  Would recommend switching to diltiazem 180 mg daily and discontinuing amlodipine.  Decrease metoprolol to 12.5 mg daily for 1 week and then discontinue.  Further optimize diltiazem dose as needed to control symptoms. 2. I do not believe the chest discomfort is cardiac related especially given the normal workup that is already been done. 3. Resolved on today's EKG.  Overall, I reassured the patient.  Michelle Joseph is very frightened by this and became tearful as we discussed the findings.  Michelle Joseph was reassured that her heart was strong and there was no coronary disease.  I reassured her that this is not associated with risk of sudden death.  Helped her to understand that each patient's therapy has to be tailored to fit their clinical situation.  Diltiazem is been chosen although.Harwani/Sanders may decide to switch to verapamil.  The lower dose that I have chosen may need to be further titrated if symptoms do not resolve.  Also cautioned her to try to get an EKG done if Michelle Joseph develops sustained tachycardia.    Medication Adjustments/Labs and Tests Ordered: Current medicines are reviewed at length with the patient today.  Concerns regarding medicines are outlined above.  Medication changes, Labs and Tests ordered today are listed in the Patient Instructions below. Patient Instructions  Medication Instructions:  1) DISCONTINUE Amlodipine 2) DECREASE Toprol to 12.5mg  once daily for 6 doses, then discontinue. 3) START Diltiazem 180mg  once  daily  Labwork: None  Testing/Procedures: None  Follow-Up: Your physician recommends that you schedule a follow-up appointment with Dr. Baird Cancer or Dr. Terrence Dupont.  Your physician recommends that you schedule a follow-up appointment as needed with Dr. Tamala Julian.     Any Other Special Instructions Will  Be Listed Below (If Applicable).     If you need a refill on your cardiac medications before your next appointment, please call your pharmacy.      Signed, Sinclair Grooms, MD  03/20/2018 10:54 AM    [facility] [address], [place], [place]  [address] Phone: [phone]; Fax: [phone]

## 2018-03-20 ENCOUNTER — Encounter (INDEPENDENT_AMBULATORY_CARE_PROVIDER_SITE_OTHER): Payer: Self-pay

## 2018-03-20 ENCOUNTER — Ambulatory Visit (INDEPENDENT_AMBULATORY_CARE_PROVIDER_SITE_OTHER): Payer: 59 | Admitting: Interventional Cardiology

## 2018-03-20 ENCOUNTER — Encounter: Payer: Self-pay | Admitting: Interventional Cardiology

## 2018-03-20 VITALS — BP 122/78 | HR 73 | Ht 69.0 in | Wt 158.0 lb

## 2018-03-20 DIAGNOSIS — R079 Chest pain, unspecified: Secondary | ICD-10-CM

## 2018-03-20 DIAGNOSIS — R002 Palpitations: Secondary | ICD-10-CM | POA: Diagnosis not present

## 2018-03-20 DIAGNOSIS — I447 Left bundle-branch block, unspecified: Secondary | ICD-10-CM

## 2018-03-20 MED ORDER — DILTIAZEM HCL ER COATED BEADS 180 MG PO CP24: 180.0000 mg | ORAL_CAPSULE | Freq: Every day | ORAL | 3 refills | Status: AC

## 2018-03-20 NOTE — Patient Instructions (Signed)
Medication Instructions:  1) DISCONTINUE Amlodipine 2) DECREASE Toprol to 12.5mg  once daily for 6 doses, then discontinue. 3) START Diltiazem 180mg  once daily  Labwork: None  Testing/Procedures: None  Follow-Up: Your physician recommends that you schedule a follow-up appointment with Dr. Baird Cancer or Dr. Terrence Dupont.  Your physician recommends that you schedule a follow-up appointment as needed with Dr. Tamala Julian.     Any Other Special Instructions Will Be Listed Below (If Applicable).     If you need a refill on your cardiac medications before your next appointment, please call your pharmacy.

## 2018-03-30 ENCOUNTER — Ambulatory Visit: Payer: 59 | Admitting: Interventional Cardiology

## 2018-04-07 ENCOUNTER — Telehealth: Payer: Self-pay | Admitting: Interventional Cardiology

## 2018-04-07 NOTE — Telephone Encounter (Signed)
Spoke with pt and advised her after her new pt appt with Dr Tamala Julian he said for her to f/u with Dr. Terrence Dupont and to have him adjust medications if needed.  Pt states she tried calling them but he isn't in yet.  Pt will try them again.  Pt appreciative for call.

## 2018-04-07 NOTE — Telephone Encounter (Signed)
New message    Pt c/o medication issue:  1. Name of Medication: diltiazem (CARDIZEM CD) 180 MG 24 hr capsule  2. How are you currently taking this medication (dosage and times per day)? Take 1 capsule (180 mg total) by mouth daily.  3. Are you having a reaction (difficulty breathing--STAT)? No  4. What is your medication issue?  Patient states she wants to stop taking medication and go back to Metoprolol    Pt c/o BP issue: STAT if pt c/o blurred vision, one-sided weakness or slurred speech  1. What are your last 5 BP readings? 156/86, 146/78 2. Are you having any other symptoms (ex. Dizziness, headache, blurred vision, passed out)? Headache, lightheaded  3. What is your BP issue? Feels BP too high

## 2018-05-07 DIAGNOSIS — N951 Menopausal and female climacteric states: Secondary | ICD-10-CM | POA: Diagnosis not present

## 2018-05-07 DIAGNOSIS — R399 Unspecified symptoms and signs involving the genitourinary system: Secondary | ICD-10-CM | POA: Diagnosis not present

## 2018-05-14 DIAGNOSIS — I10 Essential (primary) hypertension: Secondary | ICD-10-CM | POA: Diagnosis not present

## 2018-05-14 DIAGNOSIS — E785 Hyperlipidemia, unspecified: Secondary | ICD-10-CM | POA: Diagnosis not present

## 2018-05-14 DIAGNOSIS — K219 Gastro-esophageal reflux disease without esophagitis: Secondary | ICD-10-CM | POA: Diagnosis not present

## 2018-05-18 DIAGNOSIS — R3915 Urgency of urination: Secondary | ICD-10-CM | POA: Diagnosis not present

## 2018-05-18 DIAGNOSIS — Z0000 Encounter for general adult medical examination without abnormal findings: Secondary | ICD-10-CM | POA: Diagnosis not present

## 2018-05-27 DIAGNOSIS — R600 Localized edema: Secondary | ICD-10-CM | POA: Diagnosis not present

## 2018-05-27 DIAGNOSIS — Z0001 Encounter for general adult medical examination with abnormal findings: Secondary | ICD-10-CM | POA: Diagnosis not present

## 2018-06-09 DIAGNOSIS — R311 Benign essential microscopic hematuria: Secondary | ICD-10-CM | POA: Diagnosis not present

## 2018-06-09 DIAGNOSIS — R3915 Urgency of urination: Secondary | ICD-10-CM | POA: Diagnosis not present

## 2018-06-16 DIAGNOSIS — H2513 Age-related nuclear cataract, bilateral: Secondary | ICD-10-CM | POA: Diagnosis not present

## 2018-06-17 DIAGNOSIS — R311 Benign essential microscopic hematuria: Secondary | ICD-10-CM | POA: Diagnosis not present

## 2018-06-17 DIAGNOSIS — R3129 Other microscopic hematuria: Secondary | ICD-10-CM | POA: Diagnosis not present

## 2018-07-01 DIAGNOSIS — R350 Frequency of micturition: Secondary | ICD-10-CM | POA: Diagnosis not present

## 2018-07-01 DIAGNOSIS — M545 Low back pain: Secondary | ICD-10-CM | POA: Diagnosis not present

## 2018-07-01 DIAGNOSIS — R3915 Urgency of urination: Secondary | ICD-10-CM | POA: Diagnosis not present

## 2018-07-03 DIAGNOSIS — E785 Hyperlipidemia, unspecified: Secondary | ICD-10-CM | POA: Diagnosis not present

## 2018-07-03 DIAGNOSIS — R002 Palpitations: Secondary | ICD-10-CM | POA: Diagnosis not present

## 2018-07-03 DIAGNOSIS — I10 Essential (primary) hypertension: Secondary | ICD-10-CM | POA: Diagnosis not present

## 2018-10-02 DIAGNOSIS — I10 Essential (primary) hypertension: Secondary | ICD-10-CM | POA: Diagnosis not present

## 2018-10-02 DIAGNOSIS — E785 Hyperlipidemia, unspecified: Secondary | ICD-10-CM | POA: Diagnosis not present

## 2018-10-08 DIAGNOSIS — R42 Dizziness and giddiness: Secondary | ICD-10-CM | POA: Diagnosis not present

## 2018-10-08 DIAGNOSIS — I499 Cardiac arrhythmia, unspecified: Secondary | ICD-10-CM | POA: Diagnosis not present

## 2018-10-08 DIAGNOSIS — J309 Allergic rhinitis, unspecified: Secondary | ICD-10-CM | POA: Diagnosis not present

## 2018-10-09 DIAGNOSIS — I447 Left bundle-branch block, unspecified: Secondary | ICD-10-CM | POA: Diagnosis not present

## 2018-10-09 DIAGNOSIS — R002 Palpitations: Secondary | ICD-10-CM | POA: Diagnosis not present

## 2018-10-09 DIAGNOSIS — R0789 Other chest pain: Secondary | ICD-10-CM | POA: Diagnosis not present

## 2018-10-20 DIAGNOSIS — Z23 Encounter for immunization: Secondary | ICD-10-CM | POA: Diagnosis not present

## 2018-11-18 DIAGNOSIS — Z9189 Other specified personal risk factors, not elsewhere classified: Secondary | ICD-10-CM | POA: Diagnosis not present

## 2018-11-19 DIAGNOSIS — Z1231 Encounter for screening mammogram for malignant neoplasm of breast: Secondary | ICD-10-CM | POA: Diagnosis not present

## 2018-12-01 DIAGNOSIS — I10 Essential (primary) hypertension: Secondary | ICD-10-CM | POA: Diagnosis not present

## 2018-12-01 DIAGNOSIS — M545 Low back pain: Secondary | ICD-10-CM | POA: Diagnosis not present

## 2018-12-01 DIAGNOSIS — R350 Frequency of micturition: Secondary | ICD-10-CM | POA: Diagnosis not present

## 2018-12-01 DIAGNOSIS — I447 Left bundle-branch block, unspecified: Secondary | ICD-10-CM | POA: Diagnosis not present

## 2018-12-01 DIAGNOSIS — R0789 Other chest pain: Secondary | ICD-10-CM | POA: Diagnosis not present

## 2018-12-01 DIAGNOSIS — N3020 Other chronic cystitis without hematuria: Secondary | ICD-10-CM | POA: Diagnosis not present

## 2018-12-08 DIAGNOSIS — R928 Other abnormal and inconclusive findings on diagnostic imaging of breast: Secondary | ICD-10-CM | POA: Diagnosis not present

## 2018-12-14 DIAGNOSIS — B0229 Other postherpetic nervous system involvement: Secondary | ICD-10-CM | POA: Diagnosis not present

## 2018-12-14 DIAGNOSIS — Z79899 Other long term (current) drug therapy: Secondary | ICD-10-CM | POA: Diagnosis not present

## 2018-12-14 DIAGNOSIS — B029 Zoster without complications: Secondary | ICD-10-CM | POA: Diagnosis not present

## 2019-01-04 DIAGNOSIS — B0229 Other postherpetic nervous system involvement: Secondary | ICD-10-CM | POA: Diagnosis not present

## 2019-01-04 DIAGNOSIS — Z79899 Other long term (current) drug therapy: Secondary | ICD-10-CM | POA: Diagnosis not present

## 2019-01-04 DIAGNOSIS — B029 Zoster without complications: Secondary | ICD-10-CM | POA: Diagnosis not present

## 2019-01-21 DIAGNOSIS — N390 Urinary tract infection, site not specified: Secondary | ICD-10-CM | POA: Diagnosis not present

## 2019-02-08 DIAGNOSIS — R102 Pelvic and perineal pain: Secondary | ICD-10-CM | POA: Diagnosis not present

## 2019-02-08 DIAGNOSIS — N951 Menopausal and female climacteric states: Secondary | ICD-10-CM | POA: Diagnosis not present

## 2019-02-08 DIAGNOSIS — R350 Frequency of micturition: Secondary | ICD-10-CM | POA: Diagnosis not present

## 2019-03-05 DIAGNOSIS — I447 Left bundle-branch block, unspecified: Secondary | ICD-10-CM | POA: Diagnosis not present

## 2019-03-05 DIAGNOSIS — I10 Essential (primary) hypertension: Secondary | ICD-10-CM | POA: Diagnosis not present

## 2019-03-05 DIAGNOSIS — R0789 Other chest pain: Secondary | ICD-10-CM | POA: Diagnosis not present

## 2019-04-08 DIAGNOSIS — R399 Unspecified symptoms and signs involving the genitourinary system: Secondary | ICD-10-CM | POA: Diagnosis not present

## 2019-04-08 DIAGNOSIS — N3020 Other chronic cystitis without hematuria: Secondary | ICD-10-CM | POA: Diagnosis not present

## 2019-07-20 ENCOUNTER — Other Ambulatory Visit: Payer: Self-pay | Admitting: *Deleted

## 2019-07-20 DIAGNOSIS — Z20822 Contact with and (suspected) exposure to covid-19: Secondary | ICD-10-CM

## 2019-07-25 LAB — NOVEL CORONAVIRUS, NAA: SARS-CoV-2, NAA: NOT DETECTED

## 2019-10-12 ENCOUNTER — Ambulatory Visit (LOCAL_COMMUNITY_HEALTH_CENTER): Payer: 59

## 2019-10-12 ENCOUNTER — Other Ambulatory Visit: Payer: Self-pay

## 2019-10-12 DIAGNOSIS — Z23 Encounter for immunization: Secondary | ICD-10-CM | POA: Diagnosis not present

## 2020-08-02 ENCOUNTER — Other Ambulatory Visit: Payer: Self-pay | Admitting: Family

## 2020-08-02 ENCOUNTER — Ambulatory Visit
Admission: RE | Admit: 2020-08-02 | Discharge: 2020-08-02 | Disposition: A | Discharge status: Home / Self Care | Payer: 59 | Source: Ambulatory Visit | Attending: Family | Admitting: Family

## 2020-08-02 DIAGNOSIS — R202 Paresthesia of skin: Secondary | ICD-10-CM

## 2020-08-02 DIAGNOSIS — R200 Anesthesia of skin: Secondary | ICD-10-CM

## 2020-09-10 IMAGING — CR DG CERVICAL SPINE 2 OR 3 VIEWS
3 series · 3 of 3 positions shown · non-contrast
Comparison: 02/26/2008

CLINICAL DATA: Numbness and tingling

EXAM:
CERVICAL SPINE - 2-3 VIEW

[w c-spine lat *]
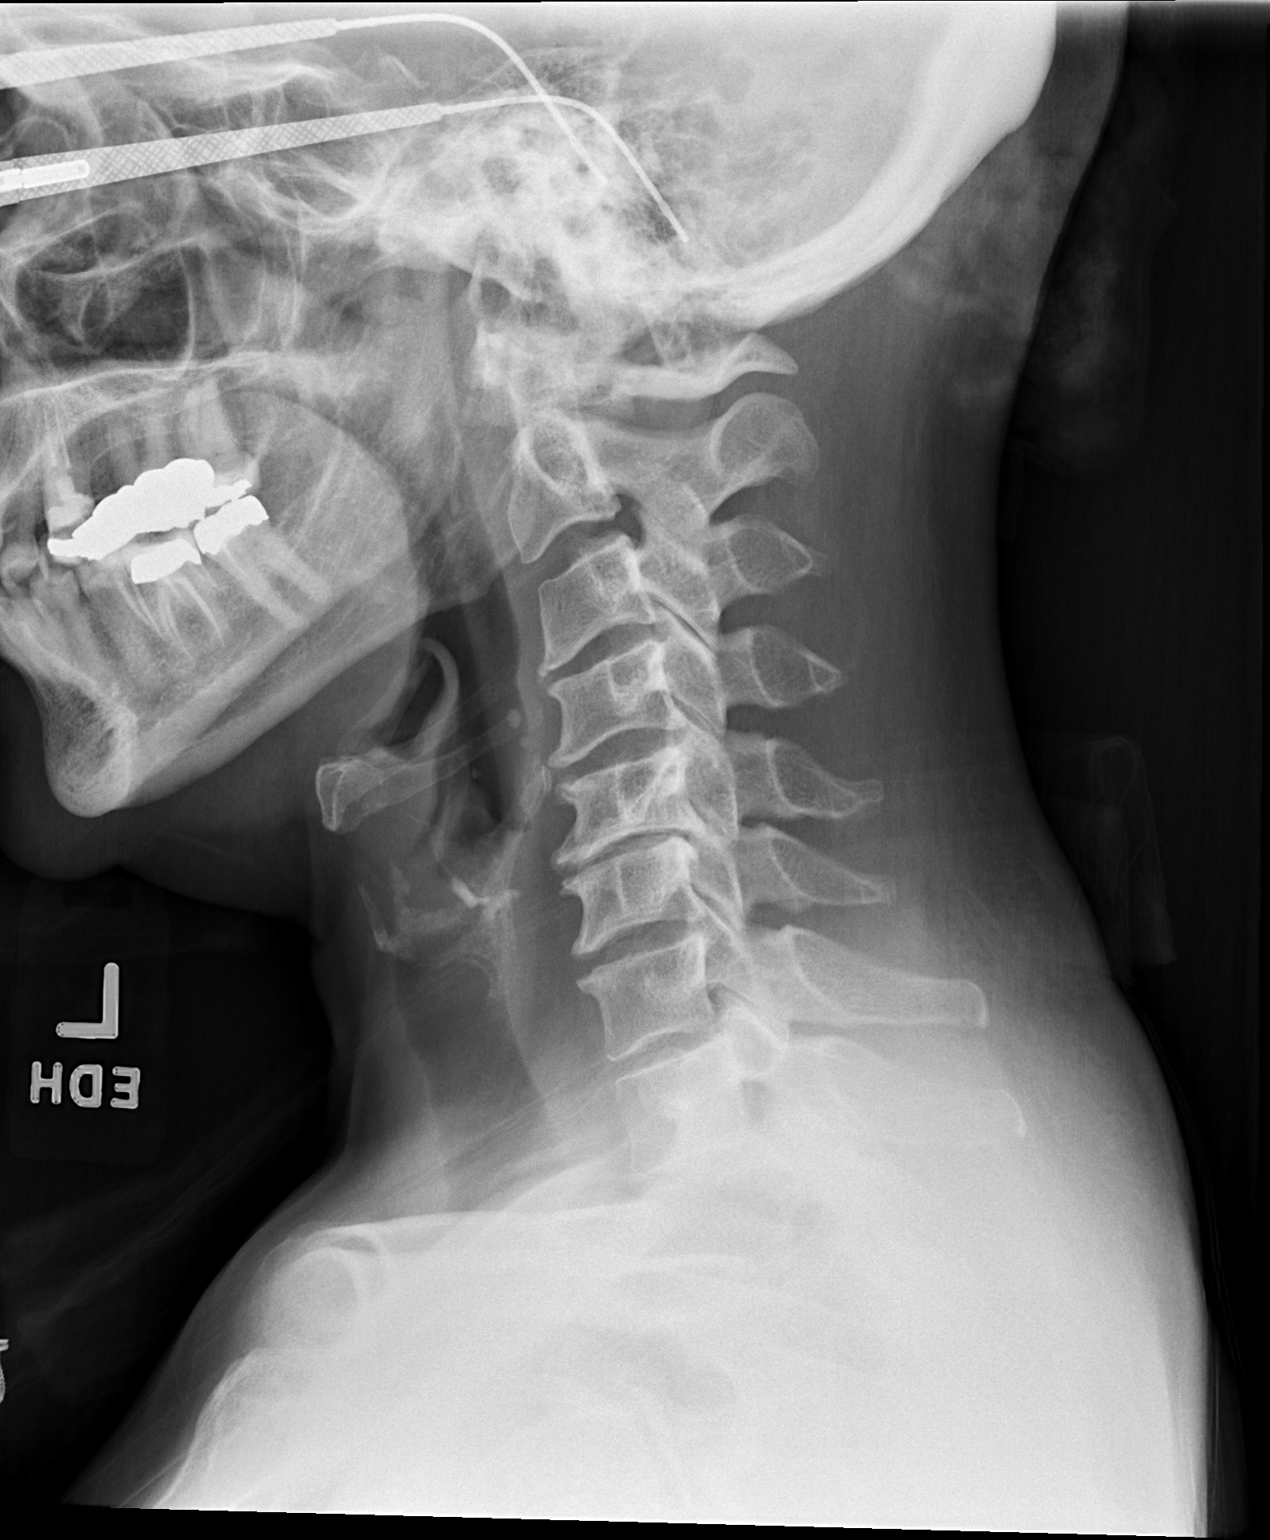

[w c-spine a.p.]
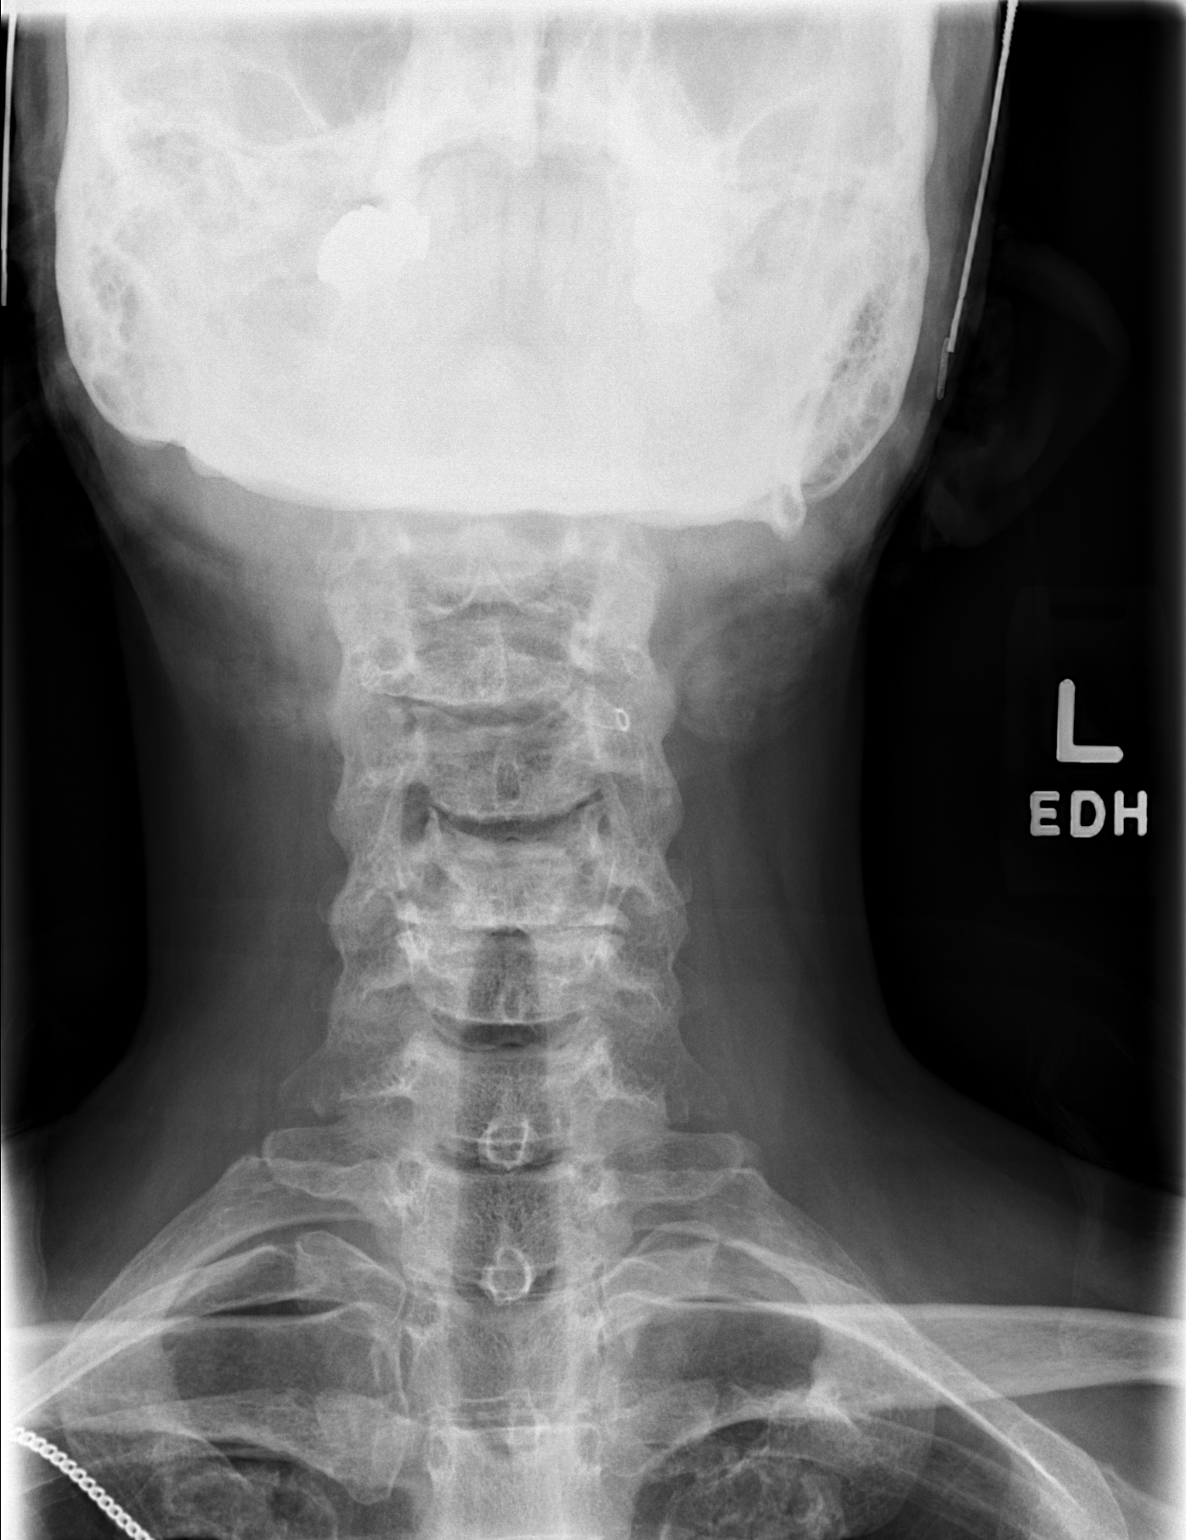

[w c-spine odontoid]
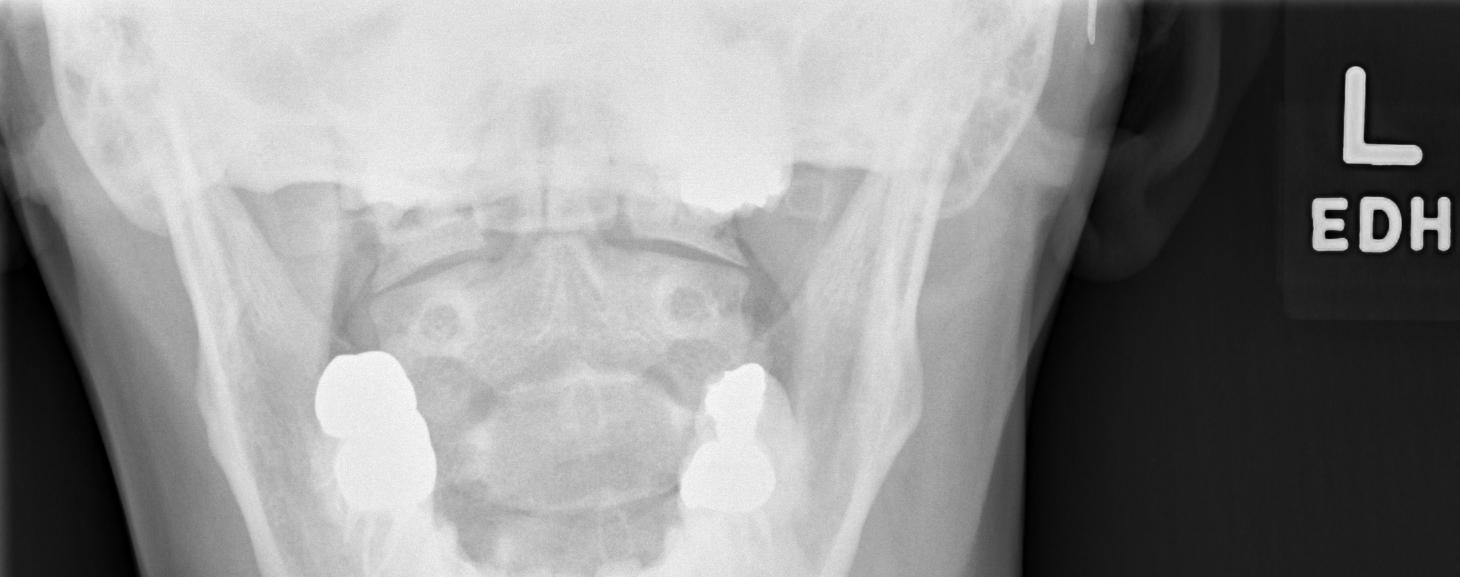

[3 of 3 positions shown; findings below may reference images not displayed]

FINDINGS: Mild reversal of cervical lordosis. Vertebral body heights are
maintained. Diffuse degenerative changes with moderate to marked
disc space narrowing and osteophyte at C5-C6. Normal prevertebral
soft tissue thickness. The dens and lateral masses are within normal
limits.
IMPRESSION: Diffuse degenerative change, most marked at C5-C6. Findings have
progressed since comparison study from [id number]

## 2021-03-26 ENCOUNTER — Encounter (HOSPITAL_COMMUNITY): Payer: Self-pay

## 2021-03-26 ENCOUNTER — Other Ambulatory Visit: Payer: Self-pay

## 2021-03-26 ENCOUNTER — Ambulatory Visit (INDEPENDENT_AMBULATORY_CARE_PROVIDER_SITE_OTHER): Payer: 59

## 2021-03-26 ENCOUNTER — Ambulatory Visit (HOSPITAL_COMMUNITY)
Admission: EM | Admit: 2021-03-26 | Discharge: 2021-03-26 | Disposition: A | Discharge status: Home / Self Care | Payer: 59 | Attending: Urgent Care | Admitting: Urgent Care

## 2021-03-26 DIAGNOSIS — R109 Unspecified abdominal pain: Secondary | ICD-10-CM

## 2021-03-26 DIAGNOSIS — K5900 Constipation, unspecified: Secondary | ICD-10-CM | POA: Diagnosis not present

## 2021-03-26 DIAGNOSIS — R1032 Left lower quadrant pain: Secondary | ICD-10-CM | POA: Diagnosis not present

## 2021-03-26 LAB — POCT URINALYSIS DIPSTICK, ED / UC
Bilirubin Urine: NEGATIVE
Glucose, UA: NEGATIVE mg/dL
Hgb urine dipstick: NEGATIVE
Ketones, ur: NEGATIVE mg/dL
Leukocytes,Ua: NEGATIVE
Nitrite: NEGATIVE
Protein, ur: NEGATIVE mg/dL
Specific Gravity, Urine: 1.01 (ref 1.005–1.030)
Urobilinogen, UA: 0.2 mg/dL (ref 0.0–1.0)
pH: 6 (ref 5.0–8.0)

## 2021-03-26 NOTE — Discharge Instructions (Signed)
You may take 500mg -650mg  Tylenol alternating with ibuprofen 400mg  every 6 hours for aches, pains, fevers and general inflammation. Follow up with your regular doctor to see if they would like to pursue an outpatient CT scan to look more internally at the organs including the colon.

## 2021-03-26 NOTE — ED Provider Notes (Signed)
[place]   MRN: [id number] DOB: February 10, 1966  Subjective:   Michelle Joseph is a 55 y.o. female presenting for 1 week history of persistent left lower flank tenderness.  Symptoms are intermittent, mild to moderate in nature.  Denies fever, nausea, vomiting, dysuria, hematuria, urinary frequency, fall, trauma.  Patient has longstanding history of a solitary kidney of the left side.  She also has a history of bulging lumbar disc of L3-L4 through L5-S1.  She also has a history of various uterine fibroids.  Patient also admits that she has had longstanding history of trouble with constipation. Last BM was earlier today, may not have been complete.  She does have a history of interstitial cystitis as well, is managed by urology with Dr. Amalia Hailey.  No current facility-administered medications for this encounter.  Current Outpatient Medications:  .  albuterol (PROVENTIL HFA;VENTOLIN HFA) 108 (90 Base) MCG/ACT inhaler, Inhale 1-2 puffs into the lungs every 6 (six) hours as needed for wheezing or shortness of breath., Disp: , Rfl:  .  atorvastatin (LIPITOR) 20 MG tablet, Take 20 mg by mouth daily., Disp: , Rfl:  .  diltiazem (CARDIZEM CD) 180 MG 24 hr capsule, Take 1 capsule (180 mg total) by mouth daily., Disp: 90 capsule, Rfl: 3 .  Esomeprazole Magnesium (NEXIUM PO), Take 2 tablets by mouth daily. , Disp: , Rfl:  .  ibuprofen (ADVIL,MOTRIN) 600 MG tablet, Take 600 mg by mouth every 6 (six) hours as needed for moderate pain., Disp: , Rfl:  .  loratadine (CLARITIN) 10 MG tablet, Take 10 mg by mouth daily as needed for allergies. , Disp: , Rfl:  .  valACYclovir (VALTREX) 500 MG tablet, TAKE ONE CAPLET BY MOUTH EVERY DAY, Disp: 30 tablet, Rfl: 0   Allergies  Allergen Reactions  . Eggs Or Egg-Derived Products Itching  . Corn-Containing Products Itching  . Levaquin [Levofloxacin] Itching and Other (See Comments)    Breathing difficulty and insomnia  . Milk-Related Compounds Itching  .  Other     PT IS ALLERGIC TO WALNUTS,PECANS  . Penicillins Nausea And Vomiting  . Sulfa Antibiotics Hives    Past Medical History:  Diagnosis Date  . Abnormal Pap smear   . Bulging lumbar disc   . Bulging lumbar disc   . Chest pain   . COPD (chronic obstructive pulmonary disease) ([place])    several years ago.. resolved  . Fibroids   . GERD (gastroesophageal reflux disease)   . 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 Hypertension   . Iron deficiency anemia   . Pain in back   . Postmenopausal    AGE 35  . Pure hypercholesterolemia, unspecified   . Seasonal allergies   . Vitamin D deficiency      Past Surgical History:  Procedure Laterality Date  . ABDOMINAL HYSTERECTOMY N/A 03/21/2015   Procedure: HYSTERECTOMY ABDOMINAL WITH BILATERAL SALPINGECTOMY;  Surgeon: Delsa Bern, MD;  Location: [facility];  Service: Gynecology;  Laterality: N/A;  . WISDOM TOOTH EXTRACTION      Family History  Problem Relation Age of Onset  . Heart attack Father   . Hypertension Father   . Heart murmur Father   . Cancer Mother        breast  . Hypertension Mother     Social History   Tobacco Use  . Smoking status: Never Smoker  . Smokeless tobacco: Never Used  Substance Use Topics  . Alcohol use: No  .  Drug use: No    ROS   Objective:   Vitals: BP (!) 147/74 (BP Location: Right Arm)   Pulse 76   Temp 99.4 F (37.4 C) (Oral)   Resp 17   LMP 02/05/2015   SpO2 100%   Physical Exam Constitutional:      General: She is not in acute distress.    Appearance: Normal appearance. She is well-developed and normal weight. She is not ill-appearing, toxic-appearing or diaphoretic.  HENT:     Head: Normocephalic and atraumatic.     Right Ear: External ear normal.     Left Ear: External ear normal.     Nose: Nose normal.     Mouth/Throat:     Mouth: Mucous membranes are moist.     Pharynx: Oropharynx is clear.  Eyes:     General: No scleral icterus.    Extraocular Movements: Extraocular  movements intact.     Pupils: Pupils are equal, round, and reactive to light.  Cardiovascular:     Rate and Rhythm: Normal rate and regular rhythm.     Heart sounds: Normal heart sounds. No murmur heard. No friction rub. No gallop.   Pulmonary:     Effort: Pulmonary effort is normal. No respiratory distress.     Breath sounds: Normal breath sounds. No stridor. No wheezing, rhonchi or rales.  Abdominal:     General: Bowel sounds are normal. There is no distension.     Palpations: Abdomen is soft. There is no mass.     Tenderness: There is abdominal tenderness (over area outlined). There is no right CVA tenderness, left CVA tenderness, guarding or rebound.    Skin:    General: Skin is warm and dry.     Coloration: Skin is not pale.     Findings: No rash.  Neurological:     General: No focal deficit present.     Mental Status: She is alert and oriented to person, place, and time.  Psychiatric:        Mood and Affect: Mood normal.        Behavior: Behavior normal.        Thought Content: Thought content normal.        Judgment: Judgment normal.    DG Abd 1 View  Result Date: 03/26/2021 CLINICAL DATA:  Abdominal pain and constipation. Left lower quadrant pain. EXAM: ABDOMEN - 1 VIEW COMPARISON:  CT 06/17/2018 FINDINGS: No bowel dilatation to suggest obstruction. Moderate stool in the ascending and transverse colon, small volume of stool in the descending colon. No abnormal rectal distention. No radiopaque calculi or abnormal soft tissue calcifications. Pelvic calcifications in the pelvis correspond to phleboliths on prior CT. No osseous abnormalities are seen. IMPRESSION: Nonobstructive bowel gas pattern. Moderate stool in the proximal colon, small volume of stool distally. Electronically Signed   By: Keith Rake M.D.   On: 03/26/2021 17:00   Results for orders placed or performed during the hospital encounter of 03/26/21 (from the past 24 hour(s))  POC Urinalysis dipstick      Status: None   Collection Time: 03/26/21  4:39 PM  Result Value Ref Range   Glucose, UA NEGATIVE NEGATIVE mg/dL   Bilirubin Urine NEGATIVE NEGATIVE   Ketones, ur NEGATIVE NEGATIVE mg/dL   Specific Gravity, Urine 1.010 1.005 - 1.030   Hgb urine dipstick NEGATIVE NEGATIVE   pH 6.0 5.0 - 8.0   Protein, ur NEGATIVE NEGATIVE mg/dL   Urobilinogen, UA 0.2 0.0 - 1.0 mg/dL  Nitrite NEGATIVE NEGATIVE   Leukocytes,Ua NEGATIVE NEGATIVE    Assessment and Plan :   PDMP not reviewed this encounter.  1. Right flank pain     Undifferentiated focal flank pain.  Patient has reassuring physical exam findings.  No history of diverticulosis or diverticulitis, bloody stools.  Urinalysis equivocal.  Recommended conservative management, close follow-up with her PCP for consideration of outpatient imaging. Counseled patient on potential for adverse effects with medications prescribed/recommended today, ER and return-to-clinic precautions discussed, patient verbalized understanding.    Jaynee Eagles, PA-C 03/26/21 1721

## 2021-03-26 NOTE — ED Triage Notes (Signed)
Pt presents with left hip/side pain X 1 week with no known injury.

## 2021-05-04 IMAGING — DX DG ABDOMEN 1V
1 series · 1 of 1 positions shown · non-contrast
Comparison: CT 06/17/2018

CLINICAL DATA: Abdominal pain and constipation. Left lower quadrant
pain.

EXAM:
ABDOMEN - 1 VIEW

[abdomen kub]
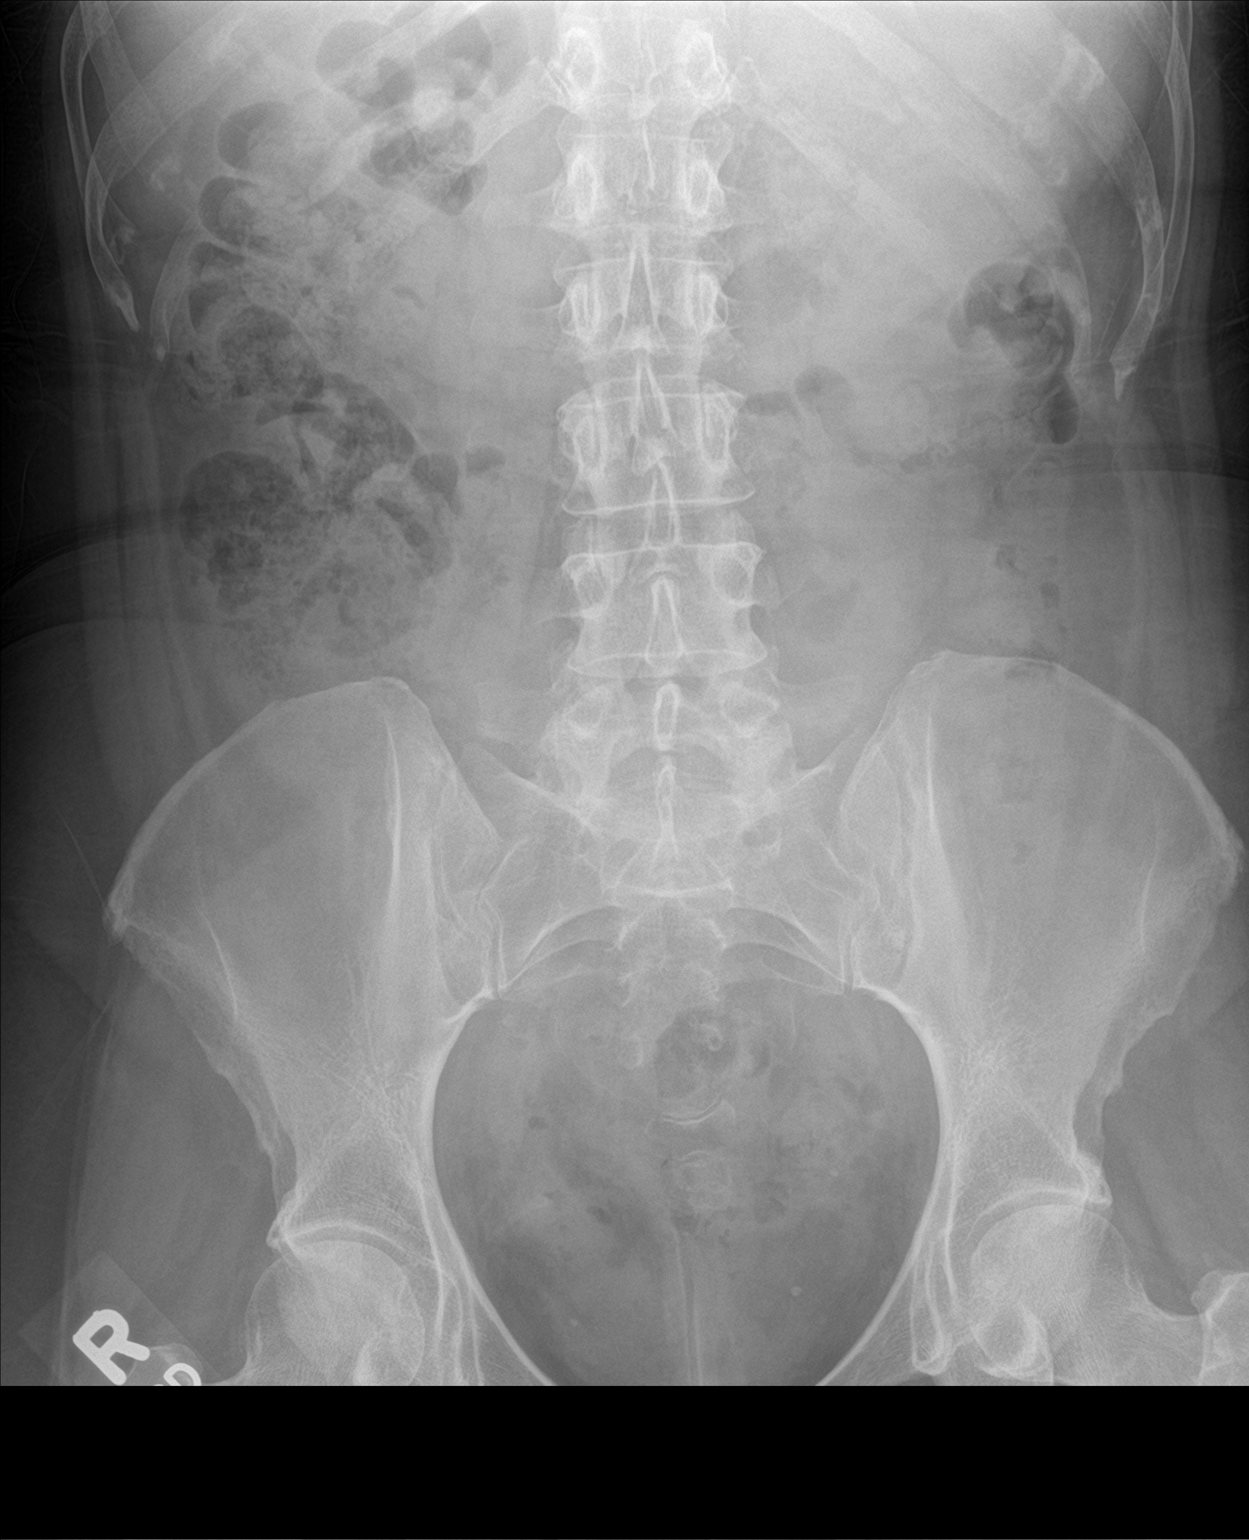

[1 of 1 positions shown; findings below may reference images not displayed]

FINDINGS: No bowel dilatation to suggest obstruction. Moderate stool in the
ascending and transverse colon, small volume of stool in the
descending colon. No abnormal rectal distention. No radiopaque
calculi or abnormal soft tissue calcifications. Pelvic
calcifications in the pelvis correspond to phleboliths on prior CT.
No osseous abnormalities are seen.
IMPRESSION: Nonobstructive bowel gas pattern. Moderate stool in the proximal
colon, small volume of stool distally.

## 2023-03-10 ENCOUNTER — Encounter (HOSPITAL_COMMUNITY): Payer: Self-pay | Admitting: Emergency Medicine

## 2023-03-10 ENCOUNTER — Ambulatory Visit (HOSPITAL_COMMUNITY)
Admission: EM | Admit: 2023-03-10 | Discharge: 2023-03-10 | Disposition: A | Discharge status: Home / Self Care | Payer: BC Managed Care – PPO | Attending: Internal Medicine | Admitting: Internal Medicine

## 2023-03-10 DIAGNOSIS — U071 COVID-19: Secondary | ICD-10-CM

## 2023-03-10 MED ORDER — BENZONATATE 100 MG PO CAPS: 100.0000 mg | ORAL_CAPSULE | Freq: Three times a day (TID) | ORAL | 0 refills | Status: AC | PRN

## 2023-03-10 NOTE — ED Triage Notes (Signed)
Pt reports that two people in her home are Covid+. Reports since Friday throat was hurting, cough, chills, fatigue and headache. Repots drinking but doesn't have appetite. Took Zyxal yesterday and drinking hot liquids.

## 2023-03-10 NOTE — Discharge Instructions (Addendum)
Please increase oral fluid intake Please take medications as directed Please isolate for a couple more days and after that please wear a well-fitting mask if you are going to be around people If you have worsening symptoms please return to urgent care to be reevaluated.

## 2023-03-11 NOTE — ED Provider Notes (Signed)
[facility]    CSN: [id number] Arrival date & time: 03/10/23  1138      History   Chief Complaint Chief Complaint  Patient presents with   Covid Exposure    HPI Michelle Joseph is a 57 y.o. female comes to urgent care to be evaluated for COVID.  Patient's mother and brother tested positive for COVID a few days ago.  Patient is currently experiencing a sore throat, chills, fever, fatigue and headache.  No nausea, vomiting or diarrhea.  Her appetite is poor.  No dizziness, near syncope or syncopal episodes.  No shortness of breath or wheezing.  Patient has a cough which disrupts her sleep.  No chest tightness or wheezing.  Patient is fully vaccinated against COVID-19 infection.  HPI  Past Medical History:  Diagnosis Date   Abnormal Pap smear    Bulging lumbar disc    Bulging lumbar disc    Chest pain    COPD (chronic obstructive pulmonary disease) (HCC)    several years ago.. 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    Hypertension    Iron deficiency anemia    Pain in back    Postmenopausal    AGE 61   Pure hypercholesterolemia, unspecified    Seasonal allergies    Vitamin D deficiency     Patient Active Problem List   Diagnosis Date Noted   Herpesvirus 2 04/28/2012   Fibroids 04/28/2012   Bulging lumbar disc 04/28/2012    Past Surgical History:  Procedure Laterality Date   ABDOMINAL HYSTERECTOMY N/A 03/21/2015   Procedure: HYSTERECTOMY ABDOMINAL WITH BILATERAL SALPINGECTOMY;  Surgeon: Delsa Bern, MD;  Location: [facility];  Service: Gynecology;  Laterality: N/A;   WISDOM TOOTH EXTRACTION      OB History     Gravida  1   Para  1   Term  1   Preterm      AB      Living  1      SAB      IAB      Ectopic      Multiple      Live Births               Home Medications    Prior to Admission medications   Medication Sig Start Date End Date Taking? Authorizing Provider   amLODipine (NORVASC) 5 MG tablet Take 5 mg by mouth daily. 02/08/20  Yes [provider]  benzonatate (TESSALON) 100 MG capsule Take 1 capsule (100 mg total) by mouth 3 (three) times daily as needed for cough. 03/10/23  Yes Tunya Held, Myrene Galas, MD  Meth-Hyo-M Barnett Hatter Phos-Ph Sal (URIBEL) 118 MG CAPS Take 1 capsule by mouth in the morning and at bedtime. 03/30/20  Yes [provider]  metoprolol tartrate (LOPRESSOR) 50 MG tablet Take 50 mg by mouth 2 (two) times daily. 05/27/18  Yes [provider]  albuterol (PROVENTIL HFA;VENTOLIN HFA) 108 (90 Base) MCG/ACT inhaler Inhale 1-2 puffs into the lungs every 6 (six) hours as needed for wheezing or shortness of breath.    [provider]  atorvastatin (LIPITOR) 20 MG tablet Take 20 mg by mouth daily. 03/05/18   [provider]  diltiazem (CARDIZEM CD) 180 MG 24 hr capsule Take 1 capsule (180 mg total) by mouth daily. 03/20/18 03/15/19  Belva Crome, MD  Esomeprazole Magnesium (NEXIUM PO) Take 2 tablets by mouth  daily.     [provider]  ibuprofen (ADVIL,MOTRIN) 600 MG tablet Take 600 mg by mouth every 6 (six) hours as needed for moderate pain.    [provider]  loratadine (CLARITIN) 10 MG tablet Take 10 mg by mouth daily as needed for allergies.     [provider]  valACYclovir (VALTREX) 500 MG tablet TAKE ONE CAPLET BY MOUTH EVERY DAY 03/16/13   Ena Dawley, MD    Family History Family History  Problem Relation Age of Onset   Heart attack Father    Hypertension Father    Heart murmur Father    Cancer Mother        breast   Hypertension Mother     Social History Social History   Tobacco Use   Smoking status: Never   Smokeless tobacco: Never  Substance Use Topics   Alcohol use: No   Drug use: No     Allergies   Egg-derived products, Ciprofloxacin, Corn-containing products, Levaquin [levofloxacin], Milk-related compounds, Nitrofurantoin, Other, Penicillins, Pentosan  polysulfate, Sulfa antibiotics, and Naproxen   Review of Systems Review of Systems As per HPI  Physical Exam Triage Vital Signs ED Triage Vitals  Enc Vitals Group     BP 03/10/23 1305 130/84     Pulse Rate 03/10/23 1305 69     Resp 03/10/23 1305 16     Temp 03/10/23 1305 99.1 F (37.3 C)     Temp Source 03/10/23 1305 Oral     SpO2 03/10/23 1305 98 %     Weight --      Height --      Head Circumference --      Peak Flow --      Pain Score 03/10/23 1300 5     Pain Loc --      Pain Edu? --      Excl. in [place]? --    No data found.  Updated Vital Signs BP 130/84 (BP Location: Left Arm)   Pulse 69   Temp 99.1 F (37.3 C) (Oral)   Resp 16   LMP 02/05/2015   SpO2 98%   Visual Acuity Right Eye Distance:   Left Eye Distance:   Bilateral Distance:    Right Eye Near:   Left Eye Near:    Bilateral Near:     Physical Exam Vitals and nursing note reviewed.  Constitutional:      Appearance: Normal appearance.  HENT:     Right Ear: Tympanic membrane normal.     Left Ear: Tympanic membrane normal.  Eyes:     Extraocular Movements: Extraocular movements intact.     Conjunctiva/sclera: Conjunctivae normal.  Cardiovascular:     Rate and Rhythm: Normal rate and regular rhythm.     Pulses: Normal pulses.     Heart sounds: Normal heart sounds.  Pulmonary:     Effort: Pulmonary effort is normal.     Breath sounds: Normal breath sounds.  Musculoskeletal:        General: Normal range of motion.  Neurological:     Mental Status: She is alert.      UC Treatments / Results  Labs (all labs ordered are listed, but only abnormal results are displayed) Labs Reviewed - No data to display  EKG   Radiology No results found.  Procedures Procedures (including critical care time)  Medications Ordered in UC Medications - No data to display  Initial Impression / Assessment and Plan / UC Course  I have reviewed  the triage vital signs and the nursing notes.  Pertinent  labs & imaging results that were available during my care of the patient were reviewed by me and considered in my medical decision making (see chart for details).     1.  COVID-19 infection: Tessalon Perles as needed for cough No indication for COVID testing because of patient's symptomatology and exposures makes COVID-19 infection very likely Maintain adequate hydration Tylenol/Motrin as needed for pain and/or fever Final Clinical Impressions(s) / UC Diagnoses   Final diagnoses:  COVID-19 virus infection     Discharge Instructions      Please increase oral fluid intake Please take medications as directed Please isolate for a couple more days and after that please wear a well-fitting mask if you are going to be around people If you have worsening symptoms please return to urgent care to be reevaluated.   ED Prescriptions     Medication Sig Dispense Auth. Provider   benzonatate (TESSALON) 100 MG capsule Take 1 capsule (100 mg total) by mouth 3 (three) times daily as needed for cough. 21 capsule Delaina Fetsch, Myrene Galas, MD      PDMP not reviewed this encounter.   Chase Picket, MD 03/11/23 [phone]

## 2024-10-01 ENCOUNTER — Other Ambulatory Visit: Payer: Self-pay | Admitting: Obstetrics and Gynecology

## 2024-10-01 DIAGNOSIS — Z1231 Encounter for screening mammogram for malignant neoplasm of breast: Secondary | ICD-10-CM

## 2024-10-20 ENCOUNTER — Ambulatory Visit
Admission: RE | Admit: 2024-10-20 | Discharge: 2024-10-20 | Disposition: A | Discharge status: Home / Self Care | Source: Ambulatory Visit | Attending: Obstetrics and Gynecology | Admitting: Obstetrics and Gynecology

## 2024-10-20 DIAGNOSIS — Z1231 Encounter for screening mammogram for malignant neoplasm of breast: Secondary | ICD-10-CM
